# Patient Record
Sex: Male | Born: 1970 | Race: Black or African American | Hispanic: No | Marital: Married | Smoking: Former smoker
Health system: Southern US, Community
[De-identification: ages and names within clinical notes are randomized; demographics above are authoritative.]

## PROBLEM LIST (undated history)

## (undated) DIAGNOSIS — M7022 Olecranon bursitis, left elbow: Secondary | ICD-10-CM

## (undated) DIAGNOSIS — D649 Anemia, unspecified: Secondary | ICD-10-CM

## (undated) DIAGNOSIS — Z8701 Personal history of pneumonia (recurrent): Secondary | ICD-10-CM

## (undated) DIAGNOSIS — M542 Cervicalgia: Secondary | ICD-10-CM

## (undated) DIAGNOSIS — M4306 Spondylolysis, lumbar region: Secondary | ICD-10-CM

## (undated) HISTORY — PX: BACK SURGERY: SHX140

## (undated) HISTORY — DX: Cervicalgia: M54.2

## (undated) HISTORY — DX: Spondylolysis, lumbar region: M43.06

---
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
## 1989-04-02 HISTORY — PX: FOOT FRACTURE SURGERY: SHX645

## 1999-11-27 ENCOUNTER — Emergency Department (HOSPITAL_COMMUNITY): Admission: EM | Admit: 1999-11-27 | Discharge: 1999-11-27 | Payer: Self-pay | Admitting: Emergency Medicine

## 2002-02-12 ENCOUNTER — Encounter: Payer: Self-pay | Admitting: Emergency Medicine

## 2002-02-12 ENCOUNTER — Emergency Department (HOSPITAL_COMMUNITY): Admission: EM | Admit: 2002-02-12 | Discharge: 2002-02-12 | Payer: Self-pay | Admitting: Emergency Medicine

## 2002-03-05 ENCOUNTER — Ambulatory Visit (HOSPITAL_COMMUNITY): Admission: RE | Admit: 2002-03-05 | Discharge: 2002-03-05 | Payer: Self-pay | Admitting: Internal Medicine

## 2002-03-05 ENCOUNTER — Encounter: Admission: RE | Admit: 2002-03-05 | Discharge: 2002-03-05 | Payer: Self-pay | Admitting: Internal Medicine

## 2002-03-05 ENCOUNTER — Encounter: Payer: Self-pay | Admitting: Internal Medicine

## 2002-03-12 ENCOUNTER — Encounter: Admission: RE | Admit: 2002-03-12 | Discharge: 2002-03-12 | Payer: Self-pay | Admitting: Internal Medicine

## 2002-03-14 ENCOUNTER — Emergency Department (HOSPITAL_COMMUNITY): Admission: EM | Admit: 2002-03-14 | Discharge: 2002-03-15 | Payer: Self-pay | Admitting: Emergency Medicine

## 2002-09-03 ENCOUNTER — Encounter: Admission: RE | Admit: 2002-09-03 | Discharge: 2002-09-03 | Payer: Self-pay | Admitting: Internal Medicine

## 2003-03-01 ENCOUNTER — Encounter: Admission: RE | Admit: 2003-03-01 | Discharge: 2003-03-01 | Payer: Self-pay | Admitting: Internal Medicine

## 2003-03-25 ENCOUNTER — Encounter: Payer: Self-pay | Admitting: Internal Medicine

## 2003-10-29 ENCOUNTER — Emergency Department (HOSPITAL_COMMUNITY): Admission: EM | Admit: 2003-10-29 | Discharge: 2003-10-29 | Payer: Self-pay | Admitting: Family Medicine

## 2005-11-06 ENCOUNTER — Emergency Department (HOSPITAL_COMMUNITY): Admission: EM | Admit: 2005-11-06 | Discharge: 2005-11-06 | Payer: Self-pay | Admitting: Family Medicine

## 2005-12-24 ENCOUNTER — Emergency Department (HOSPITAL_COMMUNITY): Admission: EM | Admit: 2005-12-24 | Discharge: 2005-12-24 | Payer: Self-pay | Admitting: Family Medicine

## 2006-04-23 ENCOUNTER — Emergency Department (HOSPITAL_COMMUNITY): Admission: EM | Admit: 2006-04-23 | Discharge: 2006-04-23 | Payer: Self-pay | Admitting: Family Medicine

## 2006-09-05 ENCOUNTER — Emergency Department (HOSPITAL_COMMUNITY): Admission: EM | Admit: 2006-09-05 | Discharge: 2006-09-05 | Payer: Self-pay | Admitting: Emergency Medicine

## 2007-03-09 ENCOUNTER — Emergency Department (HOSPITAL_COMMUNITY): Admission: EM | Admit: 2007-03-09 | Discharge: 2007-03-09 | Payer: Self-pay | Admitting: Emergency Medicine

## 2007-03-13 ENCOUNTER — Ambulatory Visit: Payer: Self-pay | Admitting: Internal Medicine

## 2007-03-13 DIAGNOSIS — M542 Cervicalgia: Secondary | ICD-10-CM | POA: Insufficient documentation

## 2007-03-13 DIAGNOSIS — M47817 Spondylosis without myelopathy or radiculopathy, lumbosacral region: Secondary | ICD-10-CM | POA: Insufficient documentation

## 2007-04-07 ENCOUNTER — Encounter: Payer: Self-pay | Admitting: Internal Medicine

## 2007-04-08 ENCOUNTER — Ambulatory Visit (HOSPITAL_COMMUNITY): Admission: RE | Admit: 2007-04-08 | Discharge: 2007-04-08 | Payer: Self-pay | Admitting: Neurosurgery

## 2007-04-14 ENCOUNTER — Encounter: Payer: Self-pay | Admitting: Internal Medicine

## 2007-06-23 ENCOUNTER — Emergency Department (HOSPITAL_COMMUNITY): Admission: EM | Admit: 2007-06-23 | Discharge: 2007-06-23 | Payer: Self-pay | Admitting: Emergency Medicine

## 2008-01-26 ENCOUNTER — Ambulatory Visit: Payer: Self-pay | Admitting: Internal Medicine

## 2009-04-13 ENCOUNTER — Ambulatory Visit: Payer: Self-pay | Admitting: Internal Medicine

## 2010-07-26 ENCOUNTER — Encounter: Payer: Self-pay | Admitting: Internal Medicine

## 2010-11-30 ENCOUNTER — Inpatient Hospital Stay (INDEPENDENT_AMBULATORY_CARE_PROVIDER_SITE_OTHER)
Admission: RE | Admit: 2010-11-30 | Discharge: 2010-11-30 | Disposition: A | Discharge status: Home / Self Care | Payer: Managed Care, Other (non HMO) | Source: Ambulatory Visit | Attending: Family Medicine | Admitting: Family Medicine

## 2010-11-30 DIAGNOSIS — B353 Tinea pedis: Secondary | ICD-10-CM

## 2012-02-25 ENCOUNTER — Other Ambulatory Visit: Payer: Self-pay | Admitting: Internal Medicine

## 2012-02-25 NOTE — Progress Notes (Signed)
Reviewing pts on opioid combo pills with more than 325 mg tylenol. Pt has not seen [place] in a very long time. Med on med list but has never been filled. Will remove med and ask Doris to see if pt is still interested in coming to [facility].

## 2013-02-27 ENCOUNTER — Emergency Department (INDEPENDENT_AMBULATORY_CARE_PROVIDER_SITE_OTHER)
Admission: EM | Admit: 2013-02-27 | Discharge: 2013-02-27 | Disposition: A | Discharge status: Home / Self Care | Payer: Self-pay | Source: Home / Self Care | Attending: Emergency Medicine | Admitting: Emergency Medicine

## 2013-02-27 ENCOUNTER — Encounter (HOSPITAL_COMMUNITY): Payer: Self-pay | Admitting: Emergency Medicine

## 2013-02-27 DIAGNOSIS — K044 Acute apical periodontitis of pulpal origin: Secondary | ICD-10-CM

## 2013-02-27 DIAGNOSIS — K047 Periapical abscess without sinus: Secondary | ICD-10-CM

## 2013-02-27 MED ORDER — HYDROCODONE-ACETAMINOPHEN 5-325 MG PO TABS: 1.0000 | ORAL_TABLET | Freq: Four times a day (QID) | ORAL | Status: DC | PRN

## 2013-02-27 MED ORDER — AMOXICILLIN 875 MG PO TABS: 875.0000 mg | ORAL_TABLET | Freq: Two times a day (BID) | ORAL | Status: DC

## 2013-02-27 MED ORDER — IBUPROFEN 800 MG PO TABS: 800.0000 mg | ORAL_TABLET | Freq: Three times a day (TID) | ORAL | Status: DC | PRN

## 2013-02-27 NOTE — ED Provider Notes (Signed)
Medical screening examination/treatment/procedure(s) were performed by non-physician practitioner and as supervising physician I was immediately available for consultation/collaboration.  Teal Raben, M.D.  Syvanna Ciolino C Elester Apodaca, MD 02/27/13 2036 

## 2013-02-27 NOTE — ED Notes (Signed)
Dental pain left bottom tooth with a knot.  Onset yesterday

## 2013-02-27 NOTE — ED Provider Notes (Signed)
CSN: [id number]     Arrival date & time 02/27/13  1505 History   First MD Initiated Contact with Patient 02/27/13 1614     Chief Complaint  Patient presents with  . Dental Pain   (Consider location/radiation/quality/duration/timing/severity/associated sxs/prior Treatment) HPI Comments: 42 year old male presents complaining of possible dental abscess on the lower left posterior jaw. He has had a toothache in this area with increasing pain over the last couple of days. Starting last night, he noticed a knot. His wife will do this and said that he has a dental abscess. He has been taking ibuprofen without relief. He rates the pain as severe. He cannot chew anything with the outside of his mouth because it makes the pain much worse. No alleviating factors. He denies fever, chills, tongue swelling, throat swelling, shortness of breath.  Patient is a 42 y.o. male presenting with tooth pain.  Dental Pain Associated symptoms: no fever and no neck pain     Past Medical History  Diagnosis Date  . Lumbar spondylolysis     Surgery in 1992 (Dr. Jettie Booze). MRIs in 2003 and 2004. Neurosurg visit 12/04, no intervention at the time, had negative neuro exam  . Neck pain    Past Surgical History  Procedure Laterality Date  . Back surgery     No family history on file. History  Substance Use Topics  . Smoking status: Current Some Day Smoker  . Smokeless tobacco: Not on file  . Alcohol Use: No    Review of Systems  Constitutional: Negative for fever, chills and fatigue.  HENT: Positive for dental problem. Negative for sore throat.   Eyes: Negative for visual disturbance.  Respiratory: Negative for cough and shortness of breath.   Cardiovascular: Negative for chest pain, palpitations and leg swelling.  Gastrointestinal: Negative for nausea, vomiting, abdominal pain, diarrhea and constipation.  Genitourinary: Negative for dysuria, urgency, frequency and hematuria.  Musculoskeletal: Negative for  arthralgias, myalgias, neck pain and neck stiffness.  Skin: Negative for rash.  Neurological: Negative for dizziness, weakness and light-headedness.    Allergies  Review of patient's allergies indicates no known allergies.  Home Medications   Current Outpatient Rx  Name  Route  Sig  Dispense  Refill  . amoxicillin (AMOXIL) 875 MG tablet   Oral   Take 1 tablet (875 mg total) by mouth 2 (two) times daily.   20 tablet   0   . HYDROcodone-acetaminophen (NORCO) 5-325 MG per tablet   Oral   Take 1 tablet by mouth every 6 (six) hours as needed for moderate pain.   30 tablet   0   . ibuprofen (ADVIL,MOTRIN) 800 MG tablet   Oral   Take 1 tablet (800 mg total) by mouth every 8 (eight) hours as needed.   60 tablet   0    BP 125/86  Pulse 72  Temp(Src) 98.4 F (36.9 C) (Oral)  Resp 18  SpO2 100% Physical Exam  Nursing note and vitals reviewed. Constitutional: He is oriented to person, place, and time. He appears well-developed and well-nourished. No distress.  HENT:  Head: Normocephalic.  Mouth/Throat: Oropharynx is clear and moist and mucous membranes are normal. Dental abscesses (lateral to posterior lower left molar; has filling in that tooth ) present. No uvula swelling or dental caries.  Pulmonary/Chest: Effort normal. No respiratory distress.  Neurological: He is alert and oriented to person, place, and time. Coordination normal.  Skin: Skin is warm and dry. No rash noted. He is not diaphoretic.  Psychiatric: He has a normal mood and affect. Judgment normal.    ED Course  Procedures (including critical care time) Labs Review Labs Reviewed - No data to display Imaging Review No results found.    MDM   1. Dental infection    Treating with amoxicillin, ibuprofen, Norco. Followup with your dentist.  Meds ordered this encounter  Medications  . amoxicillin (AMOXIL) 875 MG tablet    Sig: Take 1 tablet (875 mg total) by mouth 2 (two) times daily.    Dispense:  20  tablet    Refill:  0    Order Specific Question:  Supervising Provider    Answer:  Lorenz Coaster, DAVID C [id number]  . ibuprofen (ADVIL,MOTRIN) 800 MG tablet    Sig: Take 1 tablet (800 mg total) by mouth every 8 (eight) hours as needed.    Dispense:  60 tablet    Refill:  0    Order Specific Question:  Supervising Provider    Answer:  Lorenz Coaster, DAVID C [id number]  . HYDROcodone-acetaminophen (NORCO) 5-325 MG per tablet    Sig: Take 1 tablet by mouth every 6 (six) hours as needed for moderate pain.    Dispense:  30 tablet    Refill:  0    Order Specific Question:  Supervising Provider    Answer:  Lorenz Coaster, DAVID C [[id number]]       Graylon Good, PA-C 02/27/13 1655

## 2013-11-30 ENCOUNTER — Encounter (HOSPITAL_COMMUNITY): Payer: Self-pay | Admitting: Emergency Medicine

## 2013-11-30 ENCOUNTER — Emergency Department (HOSPITAL_COMMUNITY)
Admission: EM | Admit: 2013-11-30 | Discharge: 2013-11-30 | Disposition: A | Discharge status: Home / Self Care | Payer: 59 | Source: Home / Self Care | Attending: Family Medicine | Admitting: Family Medicine

## 2013-11-30 DIAGNOSIS — W268XXA Contact with other sharp object(s), not elsewhere classified, initial encounter: Secondary | ICD-10-CM

## 2013-11-30 DIAGNOSIS — S61209A Unspecified open wound of unspecified finger without damage to nail, initial encounter: Secondary | ICD-10-CM

## 2013-11-30 DIAGNOSIS — S61219A Laceration without foreign body of unspecified finger without damage to nail, initial encounter: Secondary | ICD-10-CM

## 2013-11-30 MED ORDER — TETANUS-DIPHTH-ACELL PERTUSSIS 5-2.5-18.5 LF-MCG/0.5 IM SUSP
INTRAMUSCULAR | Status: AC
  Filled 2013-11-30: qty 0.5

## 2013-11-30 MED ORDER — TETANUS-DIPHTH-ACELL PERTUSSIS 5-2.5-18.5 LF-MCG/0.5 IM SUSP
0.5000 mL | Freq: Once | INTRAMUSCULAR | Status: AC
  Administered 2013-11-30: 0.5 mL via INTRAMUSCULAR

## 2013-11-30 MED ORDER — LIDOCAINE HCL (PF) 2 % IJ SOLN
INTRAMUSCULAR | Status: AC
  Filled 2013-11-30: qty 2

## 2013-11-30 NOTE — ED Provider Notes (Signed)
CSN: [id number]     Arrival date & time 11/30/13  1730 History   First MD Initiated Contact with Patient 11/30/13 1744     Chief Complaint  Patient presents with  . Extremity Laceration   (Consider location/radiation/quality/duration/timing/severity/associated sxs/prior Treatment) HPI Comments: 43 year old male presents for evaluation of the laceration over the lateral dorsal PIP of the left index finger. He was cutting a screen today when he cut the top of his finger with a box cutter. It was bleeding a lot but the bleeding has resolved with direct pressure. He denies any numbness distal to this. No systemic symptoms. No other injuries.   Past Medical History  Diagnosis Date  . Lumbar spondylolysis     Surgery in 1992 (Dr. Jettie Booze). MRIs in 2003 and 2004. Neurosurg visit 12/04, no intervention at the time, had negative neuro exam  . Neck pain    Past Surgical History  Procedure Laterality Date  . Back surgery     History reviewed. No pertinent family history. History  Substance Use Topics  . Smoking status: Current Some Day Smoker  . Smokeless tobacco: Not on file  . Alcohol Use: No    Review of Systems  Skin: Positive for wound (see history of present illness).  All other systems reviewed and are negative.   Allergies  Review of patient's allergies indicates no known allergies.  Home Medications   Prior to Admission medications   Medication Sig Start Date End Date Taking? Authorizing Provider  amoxicillin (AMOXIL) 875 MG tablet Take 1 tablet (875 mg total) by mouth 2 (two) times daily. 02/27/13   Graylon Good, PA-C  HYDROcodone-acetaminophen (NORCO) 5-325 MG per tablet Take 1 tablet by mouth every 6 (six) hours as needed for moderate pain. 02/27/13   Admir Blackwater Son Barkan, PA-C  ibuprofen (ADVIL,MOTRIN) 800 MG tablet Take 1 tablet (800 mg total) by mouth every 8 (eight) hours as needed. 02/27/13   Faraz Blackwater Mykel Sponaugle, PA-C   BP 121/78  Pulse 77  Temp(Src) 98.8 F (37.1 C)  (Oral)  SpO2 100% Physical Exam  Nursing note and vitals reviewed. Constitutional: He is oriented to person, place, and time. He appears well-developed and well-nourished. No distress.  HENT:  Head: Normocephalic.  Pulmonary/Chest: Effort normal. No respiratory distress.  Musculoskeletal:       Left hand: He exhibits tenderness and laceration. He exhibits normal capillary refill. Normal sensation noted. Normal strength noted.       Hands: Neurological: He is alert and oriented to person, place, and time. Coordination normal.  Skin: Skin is warm and dry. No rash noted. He is not diaphoretic.  Psychiatric: He has a normal mood and affect. Judgment normal.    ED Course  LACERATION REPAIR Date/Time: 11/30/2013 6:07 PM Performed by: Autumn Messing, H Authorized by: Bradd Canary D Consent: Verbal consent obtained. Risks and benefits: risks, benefits and alternatives were discussed Consent given by: patient Patient identity confirmed: verbally with patient Time out: Immediately prior to procedure a "time out" was called to verify the correct patient, procedure, equipment, support staff and site/side marked as required. Body area: upper extremity Location details: left index finger Laceration length: 1 cm Foreign bodies: no foreign bodies Tendon involvement: none Nerve involvement: none Anesthesia: digital block Local anesthetic: lidocaine 2% without epinephrine Anesthetic total: 4 ml Preparation: Patient was prepped and draped in the usual sterile fashion. Irrigation solution: saline Irrigation method: syringe Amount of cleaning: extensive Debridement: none Degree of undermining: none Skin closure: glue Approximation: close Patient  tolerance: Patient tolerated the procedure well with no immediate complications.   (including critical care time) Labs Review Labs Reviewed - No data to display  Imaging Review No results found.   MDM   1. Laceration of finger of left hand,  initial encounter    Laceration, repaired with Dermabond. Watch for signs of infection. Followup when necessary   TDaP updated    Meds ordered this encounter  Medications  . Tdap (BOOSTRIX) injection 0.5 mL    Sig:        Graylon Good, PA-C 11/30/13 1824

## 2013-11-30 NOTE — Discharge Instructions (Signed)

## 2013-11-30 NOTE — ED Notes (Signed)
C/o laceration to left index finger earlier today while trimming screen w box cutter

## 2013-12-01 NOTE — ED Provider Notes (Signed)
Medical screening examination/treatment/procedure(s) were performed by resident physician or non-physician practitioner and as supervising physician I was immediately available for consultation/collaboration.   Barkley Bruns MD.   Linna Hoff, MD 12/01/13 [phone]

## 2013-12-14 ENCOUNTER — Encounter (HOSPITAL_COMMUNITY): Payer: Self-pay | Admitting: Emergency Medicine

## 2013-12-14 ENCOUNTER — Emergency Department (HOSPITAL_COMMUNITY): Payer: 59

## 2013-12-14 DIAGNOSIS — Z8701 Personal history of pneumonia (recurrent): Secondary | ICD-10-CM | POA: Diagnosis not present

## 2013-12-14 DIAGNOSIS — R079 Chest pain, unspecified: Secondary | ICD-10-CM | POA: Insufficient documentation

## 2013-12-14 DIAGNOSIS — F17200 Nicotine dependence, unspecified, uncomplicated: Secondary | ICD-10-CM | POA: Diagnosis not present

## 2013-12-14 DIAGNOSIS — R6883 Chills (without fever): Secondary | ICD-10-CM | POA: Diagnosis present

## 2013-12-14 DIAGNOSIS — R05 Cough: Secondary | ICD-10-CM | POA: Diagnosis not present

## 2013-12-14 DIAGNOSIS — R059 Cough, unspecified: Secondary | ICD-10-CM | POA: Insufficient documentation

## 2013-12-14 DIAGNOSIS — J159 Unspecified bacterial pneumonia: Secondary | ICD-10-CM | POA: Diagnosis not present

## 2013-12-14 DIAGNOSIS — R61 Generalized hyperhidrosis: Secondary | ICD-10-CM | POA: Insufficient documentation

## 2013-12-14 DIAGNOSIS — R0682 Tachypnea, not elsewhere classified: Secondary | ICD-10-CM | POA: Insufficient documentation

## 2013-12-14 LAB — URINALYSIS, ROUTINE W REFLEX MICROSCOPIC
GLUCOSE, UA: NEGATIVE mg/dL
Ketones, ur: 15 mg/dL — AB
Nitrite: NEGATIVE
PROTEIN: 100 mg/dL — AB
SPECIFIC GRAVITY, URINE: 1.034 — AB (ref 1.005–1.030)
Urobilinogen, UA: 2 mg/dL — ABNORMAL HIGH (ref 0.0–1.0)
pH: 6 (ref 5.0–8.0)

## 2013-12-14 LAB — URINE MICROSCOPIC-ADD ON

## 2013-12-14 NOTE — ED Notes (Signed)
Cough non productive

## 2013-12-14 NOTE — ED Notes (Signed)
The pt has been ill for one week with a temp with chills feeling weak body aches.  Dizziness no nv or diarrhea and sleeping a lot still.  Urine is dark

## 2013-12-15 ENCOUNTER — Telehealth: Payer: Self-pay | Admitting: *Deleted

## 2013-12-15 ENCOUNTER — Emergency Department (HOSPITAL_COMMUNITY)
Admission: EM | Admit: 2013-12-15 | Discharge: 2013-12-15 | Disposition: A | Discharge status: Home / Self Care | Payer: 59 | Attending: Emergency Medicine | Admitting: Emergency Medicine

## 2013-12-15 DIAGNOSIS — J189 Pneumonia, unspecified organism: Secondary | ICD-10-CM

## 2013-12-15 LAB — CBC WITH DIFFERENTIAL/PLATELET
BASOS PCT: 0 % (ref 0–1)
Basophils Absolute: 0 10*3/uL (ref 0.0–0.1)
EOS ABS: 0 10*3/uL (ref 0.0–0.7)
Eosinophils Relative: 1 % (ref 0–5)
HCT: 33.3 % — ABNORMAL LOW (ref 39.0–52.0)
Hemoglobin: 11.1 g/dL — ABNORMAL LOW (ref 13.0–17.0)
Lymphocytes Relative: 15 % (ref 12–46)
Lymphs Abs: 1.1 10*3/uL (ref 0.7–4.0)
MCH: 29.6 pg (ref 26.0–34.0)
MCHC: 33.3 g/dL (ref 30.0–36.0)
MCV: 88.8 fL (ref 78.0–100.0)
MONO ABS: 1.2 10*3/uL — AB (ref 0.1–1.0)
Monocytes Relative: 17 % — ABNORMAL HIGH (ref 3–12)
NEUTROS ABS: 4.7 10*3/uL (ref 1.7–7.7)
Neutrophils Relative %: 67 % (ref 43–77)
PLATELETS: 291 10*3/uL (ref 150–400)
RBC: 3.75 MIL/uL — AB (ref 4.22–5.81)
RDW: 12.4 % (ref 11.5–15.5)
WBC: 7 10*3/uL (ref 4.0–10.5)

## 2013-12-15 LAB — BASIC METABOLIC PANEL
Anion gap: 13 (ref 5–15)
BUN: 21 mg/dL (ref 6–23)
CO2: 26 mEq/L (ref 19–32)
CREATININE: 1.15 mg/dL (ref 0.50–1.35)
Calcium: 9.1 mg/dL (ref 8.4–10.5)
Chloride: 97 mEq/L (ref 96–112)
GFR, EST AFRICAN AMERICAN: 87 mL/min — AB (ref >90)
GFR, EST NON AFRICAN AMERICAN: 75 mL/min — AB (ref >90)
Glucose, Bld: 114 mg/dL — ABNORMAL HIGH (ref 70–99)
Potassium: 3.9 mEq/L (ref 3.7–5.3)
Sodium: 136 mEq/L — ABNORMAL LOW (ref 137–147)

## 2013-12-15 MED ORDER — AZITHROMYCIN 250 MG PO TABS
500.0000 mg | ORAL_TABLET | Freq: Once | ORAL | Status: AC
  Administered 2013-12-15: 500 mg via ORAL
  Filled 2013-12-15: qty 2

## 2013-12-15 MED ORDER — AZITHROMYCIN 250 MG PO TABS: 250.0000 mg | ORAL_TABLET | Freq: Every day | ORAL | Status: DC

## 2013-12-15 MED ORDER — ACETAMINOPHEN-CODEINE #3 300-30 MG PO TABS
1.0000 | ORAL_TABLET | Freq: Once | ORAL | Status: AC
  Administered 2013-12-15: 1 via ORAL
  Filled 2013-12-15: qty 1

## 2013-12-15 MED ORDER — SODIUM CHLORIDE 0.9 % IV BOLUS (SEPSIS)
1000.0000 mL | Freq: Once | INTRAVENOUS | Status: AC
  Administered 2013-12-15: 1000 mL via INTRAVENOUS

## 2013-12-15 NOTE — ED Provider Notes (Signed)
CSN: [id number]     Arrival date & time 12/14/13  2208 History   First MD Initiated Contact with Patient 12/15/13 0012     Chief Complaint  Patient presents with  . multiple complaints      (Consider location/radiation/quality/duration/timing/severity/associated sxs/prior Treatment) HPI  Jay Vincent is a 43 y.o. male with no significant past medical history coming in with 5 days of viral URI like symptoms. Patient describes body aches initially. This been developed in the chills and diaphoresis. He states his body aches are now gone but the chills and diaphoresis continue. Today he had pleuritic chest pain is midsternal. He also describes a cough that is nonproductive. He denies any sick contacts. He has no abdominal pain nausea vomiting or diarrhea. He denies any changes in his urine.  10 Systems reviewed and are negative for acute change except as noted in the HPI.     Past Medical History  Diagnosis Date  . Lumbar spondylolysis     Surgery in 1992 (Dr. Jettie Booze). MRIs in 2003 and 2004. Neurosurg visit 12/04, no intervention at the time, had negative neuro exam  . Neck pain    Past Surgical History  Procedure Laterality Date  . Back surgery     No family history on file. History  Substance Use Topics  . Smoking status: Current Some Day Smoker  . Smokeless tobacco: Not on file  . Alcohol Use: No    Review of Systems    Allergies  Review of patient's allergies indicates no known allergies.  Home Medications   Prior to Admission medications   Medication Sig Start Date End Date Taking? Authorizing Provider  Pseudoeph-Doxylamine-DM-APAP (NYQUIL PO) Take 30 mLs by mouth at bedtime as needed (cold/sleep).   Yes Historical Provider, MD  Pseudoephedrine-DM-GG (TUSSIN COLD/COUGH PO) Take 15 mLs by mouth 2 (two) times daily as needed (cold).   Yes Historical Provider, MD   BP 118/78  Pulse 92  Temp(Src) 99.4 F (37.4 C) (Oral)  Resp 23  Ht  (1.956 m)  Wt 214  lb (97.07 kg)  BMI 25.37 kg/m2  SpO2 97% Physical Exam  Nursing note and vitals reviewed. Constitutional: He is oriented to person, place, and time. Vital signs are normal. He appears well-developed and well-nourished.  Non-toxic appearance. He does not appear ill. No distress.  HENT:  Head: Normocephalic and atraumatic.  Nose: Nose normal.  Mouth/Throat: Oropharynx is clear and moist. No oropharyngeal exudate.  Eyes: Conjunctivae and EOM are normal. Pupils are equal, round, and reactive to light. No scleral icterus.  Neck: Normal range of motion. Neck supple. No tracheal deviation, no edema, no erythema and normal range of motion present. No mass and no thyromegaly present.  Cardiovascular: Normal rate, regular rhythm, S1 normal, S2 normal, normal heart sounds, intact distal pulses and normal pulses.  Exam reveals no gallop and no friction rub.   No murmur heard. Pulses:      Radial pulses are 2+ on the right side, and 2+ on the left side.       Dorsalis pedis pulses are 2+ on the right side, and 2+ on the left side.  Pulmonary/Chest: Effort normal and breath sounds normal. No respiratory distress. He has no wheezes. He has no rhonchi. He has no rales.  Tachypnea noted  Abdominal: Soft. Normal appearance and bowel sounds are normal. He exhibits no distension, no ascites and no mass. There [id number]. There is no tenderness. There is no rebound, no guarding and  no CVA tenderness.  Musculoskeletal: Normal range of motion. He exhibits no edema and no tenderness.  Lymphadenopathy:    He has no cervical adenopathy.  Neurological: He is alert and oriented to person, place, and time. He has normal strength. No cranial nerve deficit or sensory deficit. GCS eye subscore is 4. GCS verbal subscore is 5. GCS motor subscore is 6.  Skin: Skin is warm, dry and intact. No petechiae and no rash noted. He is not diaphoretic. No erythema. No pallor.  Psychiatric: He has a normal mood and affect.  His behavior is normal. Judgment normal.    ED Course  Procedures (including critical care time) Labs Review Labs Reviewed  URINALYSIS, ROUTINE W REFLEX MICROSCOPIC - Abnormal; Notable for the following:    Color, Urine ORANGE (*)    Specific Gravity, Urine 1.034 (*)    Hgb urine dipstick TRACE (*)    Bilirubin Urine MODERATE (*)    Ketones, ur 15 (*)    Protein, ur 100 (*)    Urobilinogen, UA 2.0 (*)    Leukocytes, UA TRACE (*)    All other components within normal limits  CBC WITH DIFFERENTIAL - Abnormal; Notable for the following:    RBC 3.75 (*)    Hemoglobin 11.1 (*)    HCT 33.3 (*)    Monocytes Relative 17 (*)    Monocytes Absolute 1.2 (*)    All other components within normal limits  BASIC METABOLIC PANEL - Abnormal; Notable for the following:    Sodium 136 (*)    Glucose, Bld 114 (*)    GFR calc non Af Amer 75 (*)    GFR calc Af Amer 87 (*)    All other components within normal limits  URINE MICROSCOPIC-ADD ON    Imaging Review Dg Chest 2 View  12/14/2013   CLINICAL DATA:  Cough, shortness of breath and fever.  EXAM: CHEST  2 VIEW  COMPARISON:  None.  FINDINGS: The lungs are well-aerated. Right basilar airspace opacity is compatible with pneumonia. There is no evidence of pleural effusion or pneumothorax.  The heart is normal in size; the mediastinal contour is within normal limits. No acute osseous abnormalities are seen.  IMPRESSION: Right basilar pneumonia noted.   Electronically Signed   By: Roanna Raider M.D.   On: 12/14/2013 23:11     EKG Interpretation None      MDM   Final diagnoses:  None    Patient is to emergency department out of complaint of URI-like symptoms for the past 5 days. Chest x-ray does reveal a right-sided pneumonia. Patient labs reveal dehydration he was given a liter of fluid as well as azithromycin. We'll reassess and anticipate discharge.  Upon my repeat assessment of the patient's symptoms have drastically improved. He is  resting in the bed comfortably in no acute distress. There was no longer any tachypnea. Patient feels comfortable going home. He was instructed to continue his azithromycin, call the primary care physician within 3 days, and return for any worsening. His vital signs are now stable and within normal limits he is safe for discharge.  Tomasita Crumble, MD 12/15/13 [phone]

## 2013-12-15 NOTE — Telephone Encounter (Signed)
Pt called asking for directions on when to take antibiotic that was ordered in ED. Med is every 24 hours. Pt informed

## 2013-12-15 NOTE — ED Notes (Signed)
Pt. Reports feeling weak with chills, diaphoresis. States had flu last week and thought it was done but today noticed similar symptoms come back. Denies N/V/D.

## 2013-12-15 NOTE — Discharge Instructions (Signed)
Pneumonia, Adult Mr. Sem, you were seen today for pneumonia.  Continue taking antibiotics for the next 4 days and follow up with your regular doctor within 3 days for continued care.  If your symptoms worsen, return to the ED for repeat evaluation. Thank you. Pneumonia is an infection of the lungs. It may be caused by a germ (virus or bacteria). Some types of pneumonia can spread easily from person to person. This can happen when you cough or sneeze. HOME CARE  Only take medicine as told by your doctor.  Take your medicine (antibiotics) as told. Finish it even if you start to feel better.  Do not smoke.  You may use a vaporizer or humidifier in your room. This can help loosen thick spit (mucus).  Sleep so you are almost sitting up (semi-upright). This helps reduce coughing.  Rest. A shot (vaccine) can help prevent pneumonia. Shots are often advised for:  People over 64 years old.  Patients on chemotherapy.  People with long-term (chronic) lung problems.  People with immune system problems. GET HELP RIGHT AWAY IF:   You are getting worse.  You cannot control your cough, and you are losing sleep.  You cough up blood.  Your pain gets worse, even with medicine.  You have a fever.  Any of your problems are getting worse, not better.  You have shortness of breath or chest pain. MAKE SURE YOU:   Understand these instructions.  Will watch your condition.  Will get help right away if you are not doing well or get worse. Document Released: 09/05/2007 Document Revised: 06/11/2011 Document Reviewed: 06/09/2010 [facility] Patient Information 2015 [place], [place]. This information is not intended to replace advice given to you by your health care provider. Make sure you discuss any questions you have with your health care provider.

## 2015-01-25 ENCOUNTER — Encounter: Payer: Self-pay | Admitting: Internal Medicine

## 2015-01-25 ENCOUNTER — Ambulatory Visit (INDEPENDENT_AMBULATORY_CARE_PROVIDER_SITE_OTHER): Payer: 59 | Admitting: Internal Medicine

## 2015-01-25 ENCOUNTER — Ambulatory Visit (HOSPITAL_COMMUNITY)
Admission: RE | Admit: 2015-01-25 | Discharge: 2015-01-25 | Disposition: A | Discharge status: Home / Self Care | Payer: 59 | Source: Ambulatory Visit | Attending: Internal Medicine | Admitting: Internal Medicine

## 2015-01-25 VITALS — BP 134/81 | HR 59 | Temp 97.9°F | Ht 77.0 in | Wt 226.9 lb

## 2015-01-25 DIAGNOSIS — M79669 Pain in unspecified lower leg: Secondary | ICD-10-CM | POA: Insufficient documentation

## 2015-01-25 DIAGNOSIS — M79662 Pain in left lower leg: Secondary | ICD-10-CM

## 2015-01-25 DIAGNOSIS — R200 Anesthesia of skin: Secondary | ICD-10-CM | POA: Diagnosis not present

## 2015-01-25 DIAGNOSIS — Z23 Encounter for immunization: Secondary | ICD-10-CM

## 2015-01-25 DIAGNOSIS — M5116 Intervertebral disc disorders with radiculopathy, lumbar region: Secondary | ICD-10-CM | POA: Insufficient documentation

## 2015-01-25 DIAGNOSIS — R202 Paresthesia of skin: Secondary | ICD-10-CM | POA: Insufficient documentation

## 2015-01-25 DIAGNOSIS — Z0000 Encounter for general adult medical examination without abnormal findings: Secondary | ICD-10-CM | POA: Insufficient documentation

## 2015-01-25 LAB — D-DIMER, QUANTITATIVE: D-Dimer, Quant: 0.59 ug/mL-FEU — ABNORMAL HIGH (ref 0.00–0.48)

## 2015-01-25 MED ORDER — GABAPENTIN 300 MG PO CAPS: 300.0000 mg | ORAL_CAPSULE | Freq: Three times a day (TID) | ORAL | Status: DC

## 2015-01-25 MED ORDER — DICLOFENAC SODIUM 1 % TD GEL: 4.0000 g | Freq: Four times a day (QID) | TRANSDERMAL | Status: DC

## 2015-01-25 NOTE — Assessment & Plan Note (Signed)
Check lipid panel, BMP today.  Flu vaccine given.

## 2015-01-25 NOTE — Progress Notes (Signed)
Preliminary results by tech - Left Lower Ext. Venous Duplex Completed. Negative for deep and superficial vein thrombosis in the left lower extremity. Analisa Sledd, BS, RDMS, RVT  

## 2015-01-25 NOTE — Assessment & Plan Note (Signed)
Assessment: Patient presents w/ numbness and tingling since early September. Denies any loss of bowel or bladder, no complete loss of strength.  Has noticed weakness with plantar and dorsiflexion.  Attempted symptom relief with IcyHot, Aleve, and Tylenol with mild improvement of symptoms.  On physical exam, sensation intact, strength decreased on the left lower extremity.  Discussed treatment options at this time such as conservative therapy with medication vs repeat MRI and orthopedic referral vs PT.  Patient expresses desire to first proceed with conservative management with medication and home exercises as patient states he recalls PT exercises from 2009 after back surgery.  Plan: -proceed with conservative management: Gabapentin 300mg  TID, Voltaren gel QID prn, continue Aleve and Tylenol prn.  Start heat and ice therapy as tolerated -provided handout with home exercises -if needed in the future, can refer to PT or orthopedics.  If referred to orthopedics, will likely need repeat MRI as last was 2012 and current symptoms of left sided pain do not correlate with old MRI findings.

## 2015-01-25 NOTE — Assessment & Plan Note (Signed)
Assessment: 1 week history of left calf pain described as sharp, constant.  No history of DVT or PE.  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  Also, family history of DVT/PE in his father after a surgery.  Wells score for DVT of 1 (point given for tenderness) suggests low probability for DVT.  No rash or swelling to suggest a cellulitis.  Plan: -check D-dimer --> D-dimer positive at 0.59. -Lower extremity dopplers negative for DVT -continue symptomatic treatment with Voltaren gel, stretching, heat/ice therapy -will check BMP for any electrolyte abnormalities

## 2015-01-25 NOTE — Progress Notes (Signed)
Patient ID: Jay Vincent, male   DOB: 17-May-1970, 44 y.o.   MRN: [id number]   Subjective:   Patient ID: Jay Vincent male   DOB: 17-May-1970 44 y.o.   MRN: [id number]  HPI: [name] Frame is a 44 y.o. male with past medical history of low back pain s/p surgery in 2009 who presents to [facility] today with complaint of back pain and left calf pain.  1. 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  States he had surgery around this time with Dr. Jettie BoozeKritzler after failing physical therapy and trial of flexeril.  States he did not receive steroid injections.  In 2012, patient had repeat MRI after return of symptoms which revealed L4-L5 diffuse disc protrusion right greater than left, L5-S1 central disc bulge near the right S1 nerve root, and L3-L4 disc bulge.  Patient was set to have another surgery at this time but states he walked away from doing so after his symptoms improved without intervention.  Today, patient presents with numbness and tingling from his buttocks down to his foot on the left side.  States this has been constant since early September but is worsening.  States pain is relieved with resting or sitting.  Aggravated by extending his legs, walking especially on hard surfaces.  Patient states it is affecting his daily activities especially ability to play basketball as he notices weakness with plantar and dorsiflexion.  Patient has attempted to control symptoms with use of Icy Hot and alternating between Tylenol with Aleve.  2.  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  States last week he developed sharp, constant pain in his calf.  He has no prior history of the same.  States the pain keeps him up at night.  Pain is not related to exertion.  Denies any swelling, rash.  Reports tenderness to palpation.  No prior history of DVT or PE.    Family history positive for a father having blood clots after a surgery.  Denies any tachypnea or tachycardia.    Past Medical History  Diagnosis Date  . Lumbar spondylolysis     Surgery in 1992 (Dr. Jettie BoozeKritzler). MRIs in 2003 and 2004. Neurosurg visit 12/04, no intervention at the time, had negative neuro exam  . Neck pain    Current Outpatient Prescriptions  Medication Sig Dispense Refill  . diclofenac sodium (VOLTAREN) 1 % GEL Apply 4 g topically 4 (four) times daily. 1 Tube 2  . gabapentin (NEURONTIN) 300 MG capsule Take 1 capsule (300 mg total) by mouth 3 (three) times daily. 90 capsule 2   No current facility-administered medications for this visit.   No family history on file. Social History   Social History  . Marital Status: Married    Spouse Name: N/A  . Number of Children: N/A  . Years of Education: N/A   Social History Main Topics  . Smoking status: Current Some Day Smoker    Types: Cigars  . Smokeless tobacco: None     Comment: 3 cigars a week  . Alcohol Use: No  . Drug Use: No  . Sexual Activity: Not Asked   Other Topics Concern  . None   Social History Narrative   Review of Systems: Review of Systems  Constitutional: Negative for fever and chills.  Eyes: Negative for blurred  vision.  Respiratory: Negative for cough.   Cardiovascular: Negative for chest pain.  Gastrointestinal: Negative for nausea and vomiting.  Genitourinary: Negative for dysuria.  Musculoskeletal: Positive for back pain.  Skin: Negative for rash.  Neurological: Positive for tingling. Negative for dizziness and headaches.  Psychiatric/Behavioral: Negative for depression.     Objective:  Physical Exam: Filed Vitals:   01/25/15 0828  BP: 134/81  Pulse: 59  Temp: 97.9 F (36.6 C)  TempSrc: Oral  Height:  (1.956 m)  Weight: 226 lb 14.4 oz (102.921 kg)  SpO2: 100%   Physical Exam  Constitutional: He is oriented to person, place, and time. He appears well-developed and  well-nourished.  HENT:  Head: Normocephalic and atraumatic.  Eyes: EOM are normal.  Neck: Normal range of motion.  Cardiovascular: Normal rate and regular rhythm.   Pulmonary/Chest: Effort normal and breath sounds normal.  Abdominal: Soft. Bowel sounds are normal.  Musculoskeletal: He exhibits tenderness (left calf tenderness).  Negative Homan's sign. Strength 5/5 RLE Strength 3-4/5 LLE, more pronounced with dorsiflexion than plantarflexion. Sensation intact bilateral along dermatomal patterns  Neurological: He is alert and oriented to person, place, and time. No cranial nerve deficit.  Skin: Skin is warm and dry.  Psychiatric: He has a normal mood and affect.     Assessment & Plan:  Please see Problem List for Assessment and Plan.

## 2015-01-25 NOTE — Patient Instructions (Signed)
The following medications have been sent to your pharmacy: 1. Voltaren gel - you can apply this to your area of back pain up to 4 times per day. 2. Gabapentin  (also known as Neurontin) - take this medication 3 times per day.  You can continue to use alternating heat and ice therapy on your back for symptomatic relief.    If you notice loss of bowel or bladder, inability to walk, or other "red flag" symptoms, please go to the Emergency Department.  Herniated Disk With Rehab Between each vertebrae of the spine exists a disk. These disks contain a jelly-like material that helps cushion the spinal column. Occasionally, damage to the supportive ligaments of the vertebrae causes a disk to shift from its normal alignment and place pressure on surrounding structures, such as the spinal cord. This is called a herniated (ruptured) disk. SYMPTOMS   Pain in the back, that often affects one side.  Pain that gets worse with movement, sneezing, coughing, or straining.  Muscle spasms in the back.  Pain, numbness, or weakness affecting one arm or leg (depending on whether injury is in the neck or low back).  Muscle loss (if the condition has become chronic).  Loss of stool (bowel) or urine (bladder) function. CAUSES  Herniated disks result when a disk becomes weak. The disk eventually ruptures and places pressure on the spinal cord. Herniated disks may occur from sudden injury (acute trauma) such as heavy labor, or from ongoing (chronic) stress, such as obesity.  RISK INCREASES WITH:  Sports that involve downward or twisting pressure on the neck or spine (football, weightlifting, horseback riding competition, bowling, tennis, jogging, track, racquetball, gymnastics).  Poor strength and flexibility.  Failure to warm up properly before activity.  Family history of low back pain or disk disorders.  Previous back surgery (especially fusion).  Preexisting forward displacement of a vertebra  (spondylolisthesis).  Poor technique when lifting.  Prolonged sitting, especially with poor posture. PREVENTION  Learn and use proper technique when sitting or lifting.  Warm up and stretch properly before activity.  Maintain physical fitness:  Strength, flexibility, and endurance.  Cardiovascular fitness.  Maintain a healthy body weight.  If previously injured, avoid any intense physical activity that requires twisting of the body under uncontrollable conditions. PROGNOSIS  If treated properly, herniated disks are usually curable within 6 weeks. Sometimes, surgery is required.  RELATED COMPLICATIONS   Permanent numbness, weakness, or paralysis and muscle loss.  Chronic back pain.  Loss of bowel or bladder function.  Decreased sexual function.  Risks of surgery: infection, bleeding, injury to nerves (persistent or increased numbness, weakness, or paralysis), persistent back pain, and spinal headache. TREATMENT Treatment first involves resting from any aggravating activities and the use of ice and medicine to reduce pain and inflammation. As muscle spasms begin to decrease, it is important to perform strengthening and stretching exercises of the back muscles. These will help teach and reinforce proper body posture. These exercises may be performed at home, or with a therapist. A therapist may complete a further evaluation and recommend additional treatments, such as ultrasound, traction (for herniated disks of the neck), a cervical collar (for herniated disks of the neck), or a corset or back brace (for herniated disks of the low back). Prolonged rest may do more harm than good. Your therapist will teach you proper techniques for performing simple activities, such as lifting an object off the floor or using proper posture while sitting. At night, it is advised that  you sleep on your back, on a firm mattress, and place a pillow under your knees. Your caregiver may recommend oral  steroids or an injection of corticosteroids in the space around the spinal cord (epidural space) in order to reduce pain and inflammation. For severe cases, surgery is recommended. MEDICATION   If pain medicine is needed, nonsteroidal anti-inflammatory medicines (aspirin and ibuprofen), or other minor pain relievers (acetaminophen), are often advised.  Do not take pain medicine for 7 days before surgery.  Prescription pain relievers may be given if your caregiver thinks they are needed. Use only as directed and only as much as you need.  Ointments applied to the skin may be helpful.  Corticosteroid injections may be given. These injections should be reserved for the most serious cases, as they can only be given a certain number of times.  Oral steroids may be given to reduce inflammation, although not usually for severe (acute) injuries. HEAT AND COLD  Cold treatment (icing) relieves pain and reduces inflammation. Cold treatment should be applied for 10 to 15 minutes every 2 to 3 hours, and immediately after activity that aggravates your symptoms. Use ice packs or an ice massage.  Heat treatment may be used before performing stretching and strengthening activities prescribed by your caregiver, physical therapist, or athletic trainer. Use a heat pack or a warm water soak. SEEK MEDICAL CARE IF:   Symptoms get worse or do not improve in 2 to 4 weeks, despite treatment.  You develop loss of bowel or bladder function.  New, unexplained symptoms develop. (Drugs used in treatment may produce side effects.) EXERCISES  RANGE OF MOTION (ROM) AND STRETCHING EXERCISES - Herniated Disk (Ruptured Disk) Most people with low back pain will find that their symptoms get worse with excessive bending forward (flexion) or arching at the low back (extension). The exercises that will help resolve your symptoms will focus on the opposite motion. Your physician, physical therapist or athletic trainer will help  you determine which exercises will be most helpful to resolve your low back pain. Do not complete any exercises without first consulting with your caregiver. Discontinue any exercises that make your symptoms worse, until you speak to your caregiver. If you have pain, numbness or tingling that travels down into your buttocks, leg or foot, the goal of this therapy is for these symptoms to move closer to your back and to eventually go away. Sometimes, these leg symptoms will get better, but your low back pain may get worse. This is typically an indication of progress in your rehabilitation. Be sure to be very alert to any changes in your symptoms and to the activities you have done in the 24 hours prior to the change. Sharing this information with your caregiver will allow him or her to best treat your condition. These exercises may help you when beginning to rehabilitate your injury. Your symptoms may go away with or without further involvement from your physician, physical therapist or athletic trainer. While completing these exercises, remember:   Restoring tissue flexibility helps normal motion to return to the joints. This allows healthier, less painful movement and activity.  An effective stretch should be held for at least 30 seconds.  A stretch should never be painful. You should only feel a gentle lengthening or release in the stretched tissue. FLEXION RANGE OF MOTION AND STRETCHING EXERCISES: STRETCH - Flexion, Single Knee to Chest  Lie on a firm bed or floor, with both legs extended in front of you.  Keeping  one leg in contact with the floor, bring your opposite knee to your chest. Hold your leg in place by either grabbing behind your thigh or at your knee.  Pull until you feel a gentle stretch in your low back. Hold for __________ seconds.  Slowly release your grasp and repeat the exercise with the opposite side. Repeat __________ times. Complete this exercise __________ times per day.    STRETCH - Flexion, Double Knee to Chest   Lie on a firm bed or floor, with both legs extended in front of you.  Keeping one leg in contact with the floor, bring your opposite knee to your chest.  Tense your stomach muscles to support your back and then lift your other knee to your chest. Hold your legs in place by either grabbing behind your thighs or at your knees.  Pull both knees toward your chest until you feel a gentle stretch in your low back. Hold for __________ seconds.  Tense your stomach muscles and slowly return one leg at a time to the floor. Repeat __________ times. Complete this exercise __________ times per day.  STRETCH - Low Trunk Rotation  Lie on a firm bed or floor. Keeping your legs in front of you, bend your knees so they are both pointed toward the ceiling and your feet are flat on the floor.  Extend your arms out to the side. This will stabilize your upper body by keeping your shoulders in contact with the floor.  Gently and slowly drop both knees together to one side, until you feel a gentle stretch in your low back. Hold for __________ seconds.  Tense your stomach muscles to support your low back as you bring your knees back to the starting position. Repeat the exercise while dropping both knees to the other side. Repeat __________ times. Complete this exercise __________ times per day  EXTENSION RANGE OF MOTION AND FLEXIBILITY EXERCISES: STRETCH - Extension, Prone on Elbows   Lie on your stomach on the floor. (A bed will be too soft.) Place your palms about shoulder width apart.  Place your elbows under your shoulders. If this is too painful, stack pillows under your chest.  Allow your body to relax so that your hips drop lower and make contact more completely with the floor.  Hold this position for __________ seconds.  Slowly return to lying flat on the floor. Repeat __________ times. Complete this exercise __________ times per day.  RANGE OF MOTION -  Extension, Prone Press Ups   Lie on your stomach on the floor. (A bed will be too soft.) Place your palms about shoulder width apart and at the height of your head.  Keeping your back as relaxed as possible, slowly straighten your elbows while keeping your hips on the floor. You may adjust the placement of your hands to maximize your comfort. As you gain motion, your hands will come more underneath your shoulders.  Hold this position for __________ seconds.  Slowly return to lying flat on the floor. Repeat __________ times. Complete this exercise __________ times per day.  RANGE OF MOTION- Quadruped, Neutral Spine   Assume a hands and knees position on a firm surface. Keep your hands under your shoulders and your knees under your hips. You may place padding under your knees for comfort.  Drop your head and point your tail bone toward the ground below you. This will round out your low back like an angry cat. Hold this position for __________ seconds.  Slowly  lift your head and release your tail bone so that your back sags into a large arch, like an old horse.  Hold this position for __________ seconds.  Repeat this until you feel limber in your low back.  Now, find your "sweet spot." This will be the most comfortable position somewhere between the two previous positions. This is your neutral spine. Once you have found this position, tense your stomach muscles to support your low back.  Hold this position for __________ seconds. Repeat __________ times. Complete this exercise __________ times per day.  STRENGTHENING EXERCISES - Herniated Disk (Ruptured Disk) These exercises may help you when beginning to rehabilitate your injury. These exercises should be done near your "sweet spot." This is the neutral, low-back arch, somewhere between fully rounded and fully arched, that is your least painful position. When performed in this safe range of motion, these exercises can be used for people who  have either a flexion or extension based injury. These exercises may resolve your symptoms with or without further involvement from your physician, physical therapist or athletic trainer. While completing these exercises, remember:   Muscles can gain both the endurance and the strength needed for everyday activities through controlled exercises.  Complete these exercises as instructed by your physician, physical therapist or athletic trainer. Increase the resistance and repetitions only as guided.  You may experience muscle soreness or fatigue, but the pain or discomfort you are trying to eliminate should never worsen during these exercises. If this pain does get worse, stop and make sure you are following the directions exactly. If the pain is still present after adjustments, discontinue the exercise until you can discuss the trouble with your clinician. STRENGTHENING - Deep Abdominals, Pelvic Tilt   Lie on a firm bed or floor. Keeping your legs in front of you, bend your knees so they are both pointed toward the ceiling and your feet are flat on the floor.  Tense your lower abdominal muscles to press your low back into the floor. This motion will rotate your pelvis so that your tail bone is scooping upwards rather than pointing at your feet or into the floor.  With a gentle tension and even breathing, hold this position for __________ seconds. Repeat __________ times. Complete this exercise __________ times per day.  STRENGTHENING - Abdominals, Crunches   Lie on a firm bed or floor. Keeping your legs in front of you, bend your knees so they are both pointed toward the ceiling and your feet are flat on the floor. Cross your arms over your chest.  Slightly tip your chin down without bending your neck.  Tense your abdominals and slowly lift your trunk high enough so that your shoulder blades are just off the floor. Lifting higher can put too much stress on the low back and does not further  strengthen your abdominal muscles.  With control, return to the starting position. Repeat __________ times. Complete this exercise __________ times per day.  STRENGTHENING - Quadruped, Opposite UE/LE Lift  Assume a hands and knees position on a firm surface. Keep your hands under your shoulders and your knees under your hips. You may place padding under your knees for comfort.  Find your neutral spine and gently tense your abdominal muscles so that you can maintain this position. Your shoulders and hips should form a rectangle that is parallel with the floor and is not twisted.  Keeping your trunk steady, lift your right hand no higher than your shoulder. Then lift  your left leg no higher than your hip. Make sure you are not holding your breath. Hold this position for __________ seconds.  Continuing to keep your abdominal muscles tense and your back steady, slowly return to your starting position. Repeat with the opposite arm and leg. Repeat __________ times. Complete this exercise __________ times per day.  STRENGTHENING - Lower Abdominals, Double Knee Lift  Lie on a firm bed or floor. Keeping your legs in front of you, bend your knees so they are both pointed toward the ceiling and your feet are flat on the floor.  Tense your abdominal muscles to brace your low back and slowly lift both of your knees until they come over your hips. Be certain not to hold your breath.  Hold for __________ seconds. Using your abdominal muscles, return to the starting position in a slow and controlled manner. Repeat __________ times. Complete this exercise __________ times per day.  POSTURE AND BODY MECHANICS CONSIDERATIONS - Herniated Disc (Ruptured Disk) Keeping correct posture when sitting, standing or completing your activities will reduce the stress put on different body tissues, allowing injured tissues a chance to heal and limiting painful experiences. The following are general guidelines for improved  posture. Your physician or physical therapist will provide you with any instructions specific to your needs. While reading these guidelines, remember:  The exercises prescribed by your provider will help you build the flexibility and strength to maintain correct postures.  The correct posture provides the best environment for your joints to work. All of your joints have less wear and tear when properly supported by a spine with good posture. This means you will experience a healthier, less painful body.  Correct posture must be practiced with all of your activities, especially prolonged sitting and standing. Correct posture is as important when doing repetitive low-stress activities (typing) as it is when doing a single heavy-load activity (lifting). RESTING POSITIONS Consider which positions are most painful for you when choosing a resting position. If you have pain with flexion-based activities (sitting, bending, stooping, squatting), choose a position that allows you to rest in a less flexed posture. You would want to avoid curling into a fetal position on your side. If your pain gets worse with extension-based activities (prolonged standing, working overhead), avoid resting in an extended position such as sleeping on your stomach. Most people will find more comfort when they rest with their spine in a more neutral position, neither too rounded nor too arched. Lying on a non-sagging bed on your side, with a pillow between your knees, or on your back with a pillow under your knees will often provide some relief. Keep in mind, being in any one position for a prolonged period of time, no matter how correct your posture, can still lead to stiffness. PROPER SITTING POSTURE In order to minimize stress and discomfort on your spine, you must sit with correct posture. Sitting with good posture should be effortless for a healthy body. Returning to good posture is a gradual process. Many people can work toward  this most comfortably by using various supports until they have the flexibility and strength to maintain this posture on their own. When sitting with proper posture, your ears will fall over your shoulders and your shoulders will fall over your hips. You should use the back of the chair to support your upper back. Your low back will be in a neutral position, just slightly arched. You may place a small pillow or folded towel at the  base of your low back for support.  When working at a desk, create an environment that supports good, upright posture. Without extra support, muscles tire, which leads to excessive strain on joints and other tissues. Keep these recommendations in mind. CHAIR  A chair should be able to slide under your desk when your back makes contact with the back of the chair. This allows you to work closely.  The chair's height should allow your eyes to be level with the upper part of your monitor and your hands to be slightly lower than your elbows. BODY POSITION  Your feet should make contact with the floor. If this is not possible, use a foot rest.  Keep your ears over your shoulders. This will reduce stress on your neck and low back. INCORRECT SITTING POSTURES  If you are feeling tired and unable to assume a healthy sitting posture, do not slouch or slump. This puts excessive strain on your back tissues, causing more damage and pain. Healthier options include:  Using more support, like a lumbar pillow.  Switching tasks, to something that requires you to be upright or walking.  Talking a brief walk.  Lying down to rest in a neutral-spine position. PROLONGED STANDING WHILE SLIGHTLY LEANING FORWARD  When completing a task that requires you to lean forward while standing in one place for a long time, place either foot up on a stationary 2-4 inch high object, to help maintain the best posture. When both feet are on the ground, the low back tends to lose its slight inward curve. If  this curve flattens (or becomes too large), the back and your other joints will experience too much stress, tire more quickly and can cause pain. CORRECT STANDING POSTURES Proper standing posture should be assumed with all daily activities, even if they only take a few moments, like when brushing your teeth. As in sitting, your ears should fall over your shoulders and your shoulders should fall over your hips. You should keep a slight tension in your abdominal muscles to brace your spine. Your tailbone should point down to the ground, not behind your body, resulting in an over-extended swayback posture.  INCORRECT STANDING POSTURES  Common incorrect standing postures include a forward head, locked knees or an excessive swayback. WALKING Walk with an upright posture. Your ears, shoulders and hips should all line-up. PROLONGED ACTIVITY IN A FLEXED POSITION When completing a task that requires you to bend forward at your waist or lean over a low surface, try finding a way to stabilize 3 out of 4 of your limbs. You can place a hand or elbow on your thigh, or rest a knee on the surface you are reaching across. This will provide you more stability so that your muscles do not tire as quickly. By keeping your knees relaxed, or slightly bent, you will also reduce stress across your low back. CORRECT LIFTING TECHNIQUES DO :   Assume a wide stance. This will provide you more stability and the opportunity to get as close as possible to the object you are lifting.  Tense your abdominals to brace your spine. Then, bend at the knees and hips. Keeping your back locked in a neutral-spine position, lift using your leg muscles. Lift with your legs, keeping your back straight.  Test the weight of unknown objects before attempting to lift them.  Try to keep your elbows down by your sides, in order get the best strength from your shoulders when carrying an object.  Always  ask for help when lifting heavy or awkward  objects. INCORRECT LIFTING TECHNIQUES DO NOT:   Lock your knees when lifting, even if it is a small object.  Bend and twist. Pivot at your feet or move your feet when needing to change directions.  Assume that you can safely pick up even a paper clip, without proper posture.   This information is not intended to replace advice given to you by your health care provider. Make sure you discuss any questions you have with your health care provider.   Document Released: 03/19/2005 Document Revised: 04/09/2014 Document Reviewed: 07/01/2008 [employer] Interactive Patient Education [employer]

## 2015-01-26 LAB — BMP8+ANION GAP
ANION GAP: 13 mmol/L (ref 10.0–18.0)
BUN/Creatinine Ratio: 16 (ref 9–20)
BUN: 14 mg/dL (ref 6–24)
CO2: 26 mmol/L (ref 18–29)
CREATININE: 0.88 mg/dL (ref 0.76–1.27)
Calcium: 9 mg/dL (ref 8.7–10.2)
Chloride: 100 mmol/L (ref 97–106)
GFR calc Af Amer: 119 mL/min/{1.73_m2} (ref >59)
GFR calc non Af Amer: 103 mL/min/{1.73_m2} (ref >59)
Glucose: 91 mg/dL (ref 65–99)
POTASSIUM: 4.4 mmol/L (ref 3.5–5.2)
Sodium: 139 mmol/L (ref 136–144)

## 2015-01-26 LAB — LIPID PANEL
CHOLESTEROL TOTAL: 160 mg/dL (ref 100–199)
Chol/HDL Ratio: 3.3 ratio units (ref 0.0–5.0)
HDL: 48 mg/dL (ref >39)
LDL Calculated: 99 mg/dL (ref 0–99)
TRIGLYCERIDES: 67 mg/dL (ref 0–149)
VLDL Cholesterol Cal: 13 mg/dL (ref 5–40)

## 2015-01-26 LAB — CBC
HEMATOCRIT: 37.4 % — AB (ref 37.5–51.0)
Hemoglobin: 12 g/dL — ABNORMAL LOW (ref 12.6–17.7)
MCH: 29 pg (ref 26.6–33.0)
MCHC: 32.1 g/dL (ref 31.5–35.7)
MCV: 90 fL (ref 79–97)
Platelets: 305 10*3/uL (ref 150–379)
RBC: 4.14 x10E6/uL (ref 4.14–5.80)
RDW: 13.3 % (ref 12.3–15.4)
WBC: 4.8 10*3/uL (ref 3.4–10.8)

## 2015-01-26 NOTE — Progress Notes (Signed)
[facility] Clinic Attending  I saw and evaluated the patient.  I personally confirmed the key portions of the history and exam documented by Dr. Wallace and I reviewed pertinent patient test results.  The assessment, diagnosis, and plan were formulated together and I agree with the documentation in the resident's note. 

## 2015-01-27 ENCOUNTER — Encounter: Payer: Self-pay | Admitting: Internal Medicine

## 2015-02-07 ENCOUNTER — Other Ambulatory Visit: Payer: Self-pay | Admitting: Internal Medicine

## 2015-02-07 DIAGNOSIS — M5116 Intervertebral disc disorders with radiculopathy, lumbar region: Secondary | ICD-10-CM

## 2015-02-07 NOTE — Progress Notes (Signed)
I had seen Mr Curington with Dr Wallace last month at his appt. Spoke to wife - sxs started about Labor Day and are progressing - severity and freq. Whole left leg went numb when trying to get out of truck recently and fell. At last appt, Gaba was increased but that has provided no relief. At appt, pt wanted conservative tx bc previously, sxs resolved with just time but not the case this time - getting worse. Has known lumbar disc disease and prior surgery. I am concerned about nerve root impingement and pt has failed time and conservative tx and therefore, I am ordering lumbar MRI and F/U [facility] appt.  

## 2015-02-07 NOTE — Assessment & Plan Note (Addendum)
I had seen Mr Jay Vincent with Dr Earlene PlaterWallace last month at his appt. Spoke to wife - sxs started about Labor Day and are progressing - severity and freq. Whole left leg went numb when trying to get out of truck recently and fell. At last appt, Gaba was increased but that has provided no relief. At appt, pt wanted conservative tx bc previously, sxs resolved with just time but not the case this time - getting worse. Has known lumbar disc disease and prior surgery. I am concerned about nerve root impingement and pt has failed time and conservative tx and therefore, I am [facility] lumbar MRI [facility].

## 2015-02-11 ENCOUNTER — Ambulatory Visit (HOSPITAL_COMMUNITY)
Admission: RE | Admit: 2015-02-11 | Discharge: 2015-02-11 | Disposition: A | Discharge status: Home / Self Care | Payer: 59 | Source: Ambulatory Visit | Attending: Oncology | Admitting: Oncology

## 2015-02-11 ENCOUNTER — Ambulatory Visit (INDEPENDENT_AMBULATORY_CARE_PROVIDER_SITE_OTHER): Payer: 59 | Admitting: Internal Medicine

## 2015-02-11 ENCOUNTER — Encounter: Payer: Self-pay | Admitting: Internal Medicine

## 2015-02-11 VITALS — BP 130/65 | HR 59 | Temp 98.2°F

## 2015-02-11 DIAGNOSIS — M5116 Intervertebral disc disorders with radiculopathy, lumbar region: Secondary | ICD-10-CM

## 2015-02-11 DIAGNOSIS — D649 Anemia, unspecified: Secondary | ICD-10-CM

## 2015-02-11 DIAGNOSIS — Z0000 Encounter for general adult medical examination without abnormal findings: Secondary | ICD-10-CM

## 2015-02-11 DIAGNOSIS — Z8701 Personal history of pneumonia (recurrent): Secondary | ICD-10-CM | POA: Insufficient documentation

## 2015-02-11 DIAGNOSIS — R05 Cough: Secondary | ICD-10-CM | POA: Diagnosis not present

## 2015-02-11 MED ORDER — METHYLPREDNISOLONE 4 MG PO TBPK: ORAL_TABLET | ORAL | Status: DC

## 2015-02-11 MED ORDER — GABAPENTIN 400 MG PO CAPS: 400.0000 mg | ORAL_CAPSULE | Freq: Three times a day (TID) | ORAL | Status: DC

## 2015-02-11 MED ORDER — KETOROLAC TROMETHAMINE 30 MG/ML IJ SOLN
60.0000 mg | Freq: Once | INTRAMUSCULAR | Status: AC
  Administered 2015-02-11: 60 mg via INTRAMUSCULAR

## 2015-02-11 MED ORDER — KETOROLAC TROMETHAMINE 30 MG/ML IM SOLN: 60.0000 mg | Freq: Once | INTRAMUSCULAR | Status: DC

## 2015-02-11 MED ORDER — TRAMADOL HCL 50 MG PO TABS: 100.0000 mg | ORAL_TABLET | Freq: Two times a day (BID) | ORAL | Status: DC | PRN

## 2015-02-11 NOTE — Progress Notes (Signed)
Patient ID: Jay Vincent, male   DOB: 02-04-1971, 44 y.o.   MRN: [id number]    Subjective:   Patient ID: Jay Vincent male   DOB: 07/09/1970 44 y.o.   MRN: [id number]  HPI: Jay Vincent is a 44 y.o. 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   He has history of lumber disc herniation s/p L4-5 surgery in 1992 with last MRI on 04/30/10 with L4-5 diffuse broad-based disc protrusion and L3-4 and L5-S1 disc bulge. He was doing well until about 2 months ago when he began having low back pain that is now progressed to left sided sciatica without preceding injury, fall, or heavy lifting. He was seen on 01/25/15 and was prescribed gabapentin 300 mg TID which somewhat helped but had no response to voltaren gel. He has been using ibuprofen 800 Q 6 hr as well. He does not want to try muscle relaxant due to history of drowsiness. He has not used tramadol or narcotics for the pain. He has tried physical  therapy in the past with no response. He reports left sided LE weakness but denies bladder/bowel incontinence. He has been limping due to pain and fell about 2 weeks ago.  His pain is worse with sitting and improves with standing.   He has history of mild normocytic anemia with no prior work-up. He denies hematochezia, melena, epistaxis, or hematuria. He had microscopic hematuria on 12/14/13.  He has recently been using aspirin 81 mg daily. He denies family history of colon cancer and has never had colonoscopy.   He has history of right basilar pneumonia on 12/14/13 that was treated with azithromycin. He reports smoking cigars every few days. He denies cough, dyspnea, wheezing, or chest pain.      Past Medical History  Diagnosis Date  . Lumbar spondylolysis     Surgery in 1992 (Dr. Jettie Booze). MRIs in 2003 and 2004. Neurosurg visit 12/04, no  intervention at the time, had negative neuro exam  . Neck pain    Current Outpatient Prescriptions  Medication Sig Dispense Refill  . diclofenac sodium (VOLTAREN) 1 % GEL Apply 4 g topically 4 (four) times daily. 1 Tube 2  . gabapentin (NEURONTIN) 300 MG capsule Take 1 capsule (300 mg total) by mouth 3 (three) times daily. 90 capsule 2   No current facility-administered medications for this visit.   No family history on file. Social History   Social History  . Marital Status: Married    Spouse Name: N/A  . Number of Children: N/A  . Years of Education: N/A   Social History Main Topics  . Smoking status: Current Some Day Smoker    Types: Cigars  . Smokeless tobacco: None     Comment: 3 cigars a week  . Alcohol Use: No  . Drug Use: No  . Sexual Activity: Not Asked   Other Topics Concern  . None   Social History Narrative   Review of Systems: Review of Systems  Constitutional: Negative for fever and chills.  Eyes: Negative for blurred vision.  Respiratory: Negative for cough, shortness of breath and wheezing.   Cardiovascular: Negative for chest pain and leg swelling.  Gastrointestinal: Negative for nausea, vomiting, abdominal pain, diarrhea, constipation, blood in stool and melena.  Genitourinary: Negative for dysuria, urgency, frequency and hematuria.  Musculoskeletal: Positive for myalgias (calf pain) and  falls (2 weeks ago). Negative for back pain.  Neurological: Positive for sensory change (left sided sciatica ) and focal weakness (left LE). Negative for dizziness and headaches.  Psychiatric/Behavioral: Positive for substance abuse (cigar use).     Objective:  Physical Exam: Filed Vitals:   02/11/15 1443  BP: 130/65  Pulse: 59  Temp: 98.2 F (36.8 C)  TempSrc: Oral  SpO2: 100%    Physical Exam  Constitutional: He is oriented to person, place, and time. He appears well-developed and well-nourished. No distress.  Standing at times during exam due to pain    HENT:  Head: Normocephalic and atraumatic.  Right Ear: External ear normal.  Left Ear: External ear normal.  Nose: Nose normal.  Mouth/Throat: Oropharynx is clear and moist. No oropharyngeal exudate.  Eyes: Conjunctivae and EOM are normal. Pupils are equal, round, and reactive to light. Right eye exhibits no discharge. Left eye exhibits no discharge. No scleral icterus.  Neck: Normal range of motion. Neck supple.  Cardiovascular: Normal rate, regular rhythm and normal heart sounds.   No murmur heard. Pulmonary/Chest: Effort normal and breath sounds normal. No respiratory distress. He has no wheezes. He has no rales.  Abdominal: Soft. Bowel sounds are normal. He exhibits no distension. There is no tenderness. There is no rebound and no guarding.  Musculoskeletal: He exhibits no edema or tenderness.  No lumbar paraspinal tenderness.   Neurological: He is alert and oriented to person, place, and time. He has normal reflexes.  Positive left straight leg test. Decreased left LE lateral sensation to light touch in L5-S1 distrubution. Normal 5/5 muscle strength in b/l LE.    Skin: Skin is warm and dry. No rash noted. He is not diaphoretic. No erythema. No pallor.  Psychiatric: He has a normal mood and affect. His behavior is normal. Judgment and thought content normal.    Assessment & Plan:   Please see problem list for problem-based assessment and plan

## 2015-02-11 NOTE — Patient Instructions (Signed)
-  Will get an MRI of your back -Start taking the medrol dose pack as written on the bottle -Start taking 400 mg of gabapentin three times daily  -Start taking tramadol 100 mg twice a day as needed for pain -Take advil 600-800 mg up to four times daily as needed for pain  -Will check your bloodwork and please return the stool cards  -Will get a repeat chest xray to make sure your pneumonia is gone  -Will see you back in 4-6 weeks or sooner if needed -Very nice meeting you!  Sciatica Sciatica is pain, weakness, numbness, or tingling along your sciatic nerve. The nerve starts in the lower back and runs down the back of each leg. Nerve damage or certain conditions pinch or put pressure on the sciatic nerve. This causes the pain, weakness, and other discomforts of sciatica. HOME CARE   Only take medicine as told by your doctor.  Apply ice to the affected area for 20 minutes. Do this 3-4 times a day for the first 48-72 hours. Then try heat in the same way.  Exercise, stretch, or do your usual activities if these do not make your pain worse.  Go to physical therapy as told by your doctor.  Keep all doctor visits as told.  Do not wear high heels or shoes that are not supportive.  Get a firm mattress if your mattress is too soft to lessen pain and discomfort. GET HELP RIGHT AWAY IF:   You cannot control when you poop (bowel movement) or pee (urinate).  You have more weakness in your lower back, lower belly (pelvis), butt (buttocks), or legs.  You have redness or puffiness (swelling) of your back.  You have a burning feeling when you pee.  You have pain that gets worse when you lie down.  You have pain that wakes you from your sleep.  Your pain is worse than past pain.  Your pain lasts longer than 4 weeks.  You are suddenly losing weight without reason. MAKE SURE YOU:   Understand these instructions.  Will watch this condition.  Will get help right away if you are not doing  well or get worse.   This information is not intended to replace advice given to you by your health care provider. Make sure you discuss any questions you have with your health care provider.   Document Released: 12/27/2007 Document Revised: 12/08/2014 Document Reviewed: 07/29/2011 Elsevier Interactive Patient Education 2016 [employer]   General Instructions:   Please bring your medicines with you each time you come to clinic.  Medicines may include prescription medications, over-the-counter medications, herbal remedies, eye drops, vitamins, or other pills.   Progress Toward Treatment Goals:  No flowsheet data found.  Self Care Goals & Plans:  Self Care Goal 01/25/2015  Manage my medications take my medicines as prescribed; bring my medications to every visit; refill my medications on time  Eat healthy foods drink diet soda or water instead of juice or soda; eat more vegetables; eat foods that are low in salt; eat baked foods instead of fried foods; eat fruit for snacks and desserts    No flowsheet data found.   Care Management & Community Referrals:  No flowsheet data found.

## 2015-02-12 DIAGNOSIS — Z8701 Personal history of pneumonia (recurrent): Secondary | ICD-10-CM

## 2015-02-12 DIAGNOSIS — Z720 Tobacco use: Secondary | ICD-10-CM | POA: Insufficient documentation

## 2015-02-12 DIAGNOSIS — D649 Anemia, unspecified: Secondary | ICD-10-CM

## 2015-02-12 HISTORY — DX: Anemia, unspecified: D64.9

## 2015-02-12 HISTORY — DX: Personal history of pneumonia (recurrent): Z87.01

## 2015-02-12 LAB — ANEMIA PROFILE B
Basophils Absolute: 0.1 10*3/uL (ref 0.0–0.2)
Basos: 1 %
EOS (ABSOLUTE): 0.1 10*3/uL (ref 0.0–0.4)
Eos: 3 %
FERRITIN: 341 ng/mL (ref 30–400)
FOLATE: 3.4 ng/mL (ref >3.0)
HEMOGLOBIN: 12.4 g/dL — AB (ref 12.6–17.7)
Hematocrit: 37.5 % (ref 37.5–51.0)
IMMATURE GRANULOCYTES: 0 %
IRON SATURATION: 33 % (ref 15–55)
Immature Grans (Abs): 0 10*3/uL (ref 0.0–0.1)
Iron: 96 ug/dL (ref 38–169)
Lymphocytes Absolute: 1.7 10*3/uL (ref 0.7–3.1)
Lymphs: 38 %
MCH: 29.6 pg (ref 26.6–33.0)
MCHC: 33.1 g/dL (ref 31.5–35.7)
MCV: 90 fL (ref 79–97)
MONOCYTES: 9 %
Monocytes Absolute: 0.4 10*3/uL (ref 0.1–0.9)
NEUTROS ABS: 2.2 10*3/uL (ref 1.4–7.0)
Neutrophils: 49 %
PLATELETS: 330 10*3/uL (ref 150–379)
RBC: 4.19 x10E6/uL (ref 4.14–5.80)
RDW: 13.7 % (ref 12.3–15.4)
RETIC CT PCT: 1.6 % (ref 0.6–2.6)
TIBC: 288 ug/dL (ref 250–450)
UIBC: 192 ug/dL (ref 111–343)
Vitamin B-12: 1051 pg/mL — ABNORMAL HIGH (ref 211–946)
WBC: 4.4 10*3/uL (ref 3.4–10.8)

## 2015-02-12 LAB — URINALYSIS, ROUTINE W REFLEX MICROSCOPIC
BILIRUBIN UA: NEGATIVE
Glucose, UA: NEGATIVE
Ketones, UA: NEGATIVE
Leukocytes, UA: NEGATIVE
Nitrite, UA: NEGATIVE
PH UA: 6 (ref 5.0–7.5)
Protein, UA: NEGATIVE
RBC UA: NEGATIVE
Specific Gravity, UA: 1.024 (ref 1.005–1.030)
UUROB: 1 mg/dL (ref 0.2–1.0)

## 2015-02-12 LAB — HEPATIC FUNCTION PANEL
ALT: 46 IU/L — ABNORMAL HIGH (ref 0–44)
AST: 25 IU/L (ref 0–40)
Albumin: 4.5 g/dL (ref 3.5–5.5)
Alkaline Phosphatase: 65 IU/L (ref 39–117)
Bilirubin Total: 0.7 mg/dL (ref 0.0–1.2)
Bilirubin, Direct: 0.18 mg/dL (ref 0.00–0.40)
TOTAL PROTEIN: 7.4 g/dL (ref 6.0–8.5)

## 2015-02-12 LAB — SEDIMENTATION RATE: Sed Rate: 11 mm/hr (ref 0–15)

## 2015-02-12 LAB — HIV ANTIBODY (ROUTINE TESTING W REFLEX): HIV Screen 4th Generation wRfx: NONREACTIVE

## 2015-02-12 NOTE — Assessment & Plan Note (Addendum)
Assessment: Pt is a tobacco user with history of right basilar pneumonia on 12/14/13 that was treated with azithromycin who had no follow-up chest xray to ensure resolution.   Plan:  -Obtain 2-view chest xray ---> resolved pneumonia  -Pt counseled on tobacco cessation

## 2015-02-12 NOTE — Assessment & Plan Note (Addendum)
-  Obtain screening HIV Ab --> non-reactive  -Obtain liver function profile ---> ALT mildly elevated at 44, will obtain acute hepatitis panel  -Consider screening TSH level at next visit

## 2015-02-12 NOTE — Assessment & Plan Note (Addendum)
Assessment: Pt with history of chronic mild normocytic anemia of unclear etiology who presents with no active bleeding.   Plan:  -Obtain anemia profile B ---> Hg stable at 12.4 with no iron deficiency  -Obtain UA ---> resolved hematuria  -Pt given home stool cards  -Pt needs screening colonoscopy at age 44 -Continue to monitor

## 2015-02-12 NOTE — Assessment & Plan Note (Addendum)
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    Plan:  -Obtain stat MRI lumbar spine w/ & w/o contrast, scheduled for 02/13/15 -Obtain ESR ---> normal -Increase gabapentin from 300 mg TID to 400 mg TID  -Prescribe tramadol 100 mg BID PRN pain  -Prescribe medrol dose pack  -Continue ibuprofen 800 mg Q 6 hr PRN pain/inflammation -Pt given toradol 60 mg IM injection today   -Pt declined PT or neurosurgery referral at this time -Consider narcotic therapy if pain continues to be uncontrolled

## 2015-02-13 ENCOUNTER — Other Ambulatory Visit: Payer: 59

## 2015-02-15 LAB — HEPATITIS PANEL, ACUTE
HEP A IGM: NEGATIVE
HEP B S AG: NEGATIVE
Hep B C IgM: NEGATIVE
Hep C Virus Ab: <0.1 s/co ratio (ref 0.0–0.9)

## 2015-02-15 LAB — SPECIMEN STATUS REPORT

## 2015-02-15 NOTE — Progress Notes (Signed)
Internal Medicine Clinic Attending  Case discussed with Dr. Rabbani soon after the resident saw the patient.  We reviewed the resident's history and exam and pertinent patient test results.  I agree with the assessment, diagnosis, and plan of care documented in the resident's note.  

## 2015-02-16 LAB — SPECIMEN STATUS REPORT

## 2015-02-16 LAB — HEPATITIS PANEL, ACUTE
Hep A IgM: NEGATIVE
Hep B C IgM: NEGATIVE
Hepatitis B Surface Ag: NEGATIVE

## 2015-02-26 ENCOUNTER — Ambulatory Visit
Admission: RE | Admit: 2015-02-26 | Discharge: 2015-02-26 | Disposition: A | Discharge status: Home / Self Care | Payer: 59 | Source: Ambulatory Visit | Attending: Oncology | Admitting: Oncology

## 2015-02-26 DIAGNOSIS — M5116 Intervertebral disc disorders with radiculopathy, lumbar region: Secondary | ICD-10-CM

## 2015-02-28 ENCOUNTER — Telehealth: Payer: Self-pay | Admitting: *Deleted

## 2015-02-28 NOTE — Telephone Encounter (Signed)
Pt's wife request a call to discuss test results, [phone] ph in clinic, sending to dr's butcher, rabbani and Ciscorichardson

## 2015-03-02 NOTE — Telephone Encounter (Signed)
I discussed MRI results with her yesterday, thanks!  Dr. Johna Rolesabbani

## 2015-05-13 ENCOUNTER — Other Ambulatory Visit: Payer: Self-pay | Admitting: *Deleted

## 2015-05-13 DIAGNOSIS — M5116 Intervertebral disc disorders with radiculopathy, lumbar region: Secondary | ICD-10-CM

## 2015-05-13 MED ORDER — TRAMADOL HCL 50 MG PO TABS: 100.0000 mg | ORAL_TABLET | Freq: Two times a day (BID) | ORAL | Status: DC | PRN

## 2015-05-13 MED ORDER — GABAPENTIN 400 MG PO CAPS: 400.0000 mg | ORAL_CAPSULE | Freq: Three times a day (TID) | ORAL | Status: DC

## 2015-05-16 NOTE — Telephone Encounter (Signed)
Called to pharm 

## 2016-01-16 ENCOUNTER — Encounter: Payer: Self-pay | Admitting: Internal Medicine

## 2016-01-16 ENCOUNTER — Ambulatory Visit (INDEPENDENT_AMBULATORY_CARE_PROVIDER_SITE_OTHER): Payer: 59 | Admitting: Internal Medicine

## 2016-01-16 DIAGNOSIS — Z0000 Encounter for general adult medical examination without abnormal findings: Secondary | ICD-10-CM

## 2016-01-16 DIAGNOSIS — Z23 Encounter for immunization: Secondary | ICD-10-CM | POA: Diagnosis not present

## 2016-01-16 DIAGNOSIS — M7022 Olecranon bursitis, left elbow: Secondary | ICD-10-CM

## 2016-01-16 HISTORY — DX: Olecranon bursitis, left elbow: M70.22

## 2016-01-16 LAB — SYNOVIAL CELL COUNT + DIFF, W/ CRYSTALS
Crystals, Fluid: NONE SEEN
Lymphocytes-Synovial Fld: 10 % (ref 0–20)
Monocyte-Macrophage-Synovial Fluid: 12 % — ABNORMAL LOW (ref 50–90)
Neutrophil, Synovial: 78 % — ABNORMAL HIGH (ref 0–25)
WBC, SYNOVIAL: 840 /mm3 — AB (ref 0–200)

## 2016-01-16 NOTE — Progress Notes (Signed)
   CC: Patient is complaining of pain and swelling in his L elbow.   HPI:  [name] is a 45 y.o. M with a PMHx of conditions listed below presenting to the clinic complaining of pain and swelling in his L elbow. Please see problem based charting for the status of the patient's current and chronic medical conditions.   Past Medical History:  Diagnosis Date  . Lumbar spondylolysis    Surgery in 1992 (Dr. Jettie BoozeKritzler). MRIs in 2003 and 2004. Neurosurg visit 12/04, no intervention at the time, had negative neuro exam  . Neck pain     Review of Systems:  Pertinent positives mentioned in HPI. Remainder of all ROS negative.   Physical Exam:  Vitals:   01/16/16 1314  BP: 131/69  Pulse: 70  Temp: 98.6 F (37 C)  TempSrc: Oral  SpO2: 100%  Weight: 225 lb (102.1 kg)  Height: 6\' 5"  (1.956 m)   Physical Exam  Constitutional: He is oriented to person, place, and time. He appears well-developed and well-nourished. No distress.  HENT:  Head: Normocephalic and atraumatic.  Mouth/Throat: Oropharynx is clear and moist.  Eyes: EOM are normal. Pupils are equal, round, and reactive to light.  Neck: Neck supple. No tracheal deviation present.  Cardiovascular: Normal rate, regular rhythm and intact distal pulses.   Pulmonary/Chest: Effort normal and breath sounds normal. No respiratory distress.  Abdominal: Soft. Bowel sounds are normal. He exhibits no distension. There is no tenderness. There is no guarding.  Musculoskeletal: Normal range of motion.  L elbow: Area appears slightly warm and swollen. Normal ROM of elbow joint. Fluctuant mass palpated over the olecranon process. No erythema or rash noted.   Neurological: He is alert and oriented to person, place, and time.  Skin: Skin is warm and dry.    Assessment & Plan:   See Encounters Tab for problem based charting.  Patient seen with Dr. Josem KaufmannKlima

## 2016-01-16 NOTE — Patient Instructions (Addendum)
[name] it was nice meeting you today.  -Avoid leaning your elbow on hard surfaces.   -You may take over-the-counter Ibuprofen as needed for pain.   -I have ordered some labs today and will call you when the results come back.   -Please call the clinic immediately if you develop a fever or the pain/ swelling in your elbow become worse.    Elbow Bursitis Elbow bursitis is inflammation of the fluid-filled sac (bursa) between the tip of your elbow bone (olecranon) and your skin. Elbow bursitis may also be called olecranon bursitis. Normally, the olecranon bursa has only a small amount of fluid in it to cushion and protect your elbow bone. Elbow bursitis causes fluid to build up inside the bursa. Over time, this swelling and inflammation can cause pain when you bend or lean on your elbow.  CAUSES Elbow bursitis may be caused by:   Elbow injury (acute trauma).  Leaning on hard surfaces for long periods of time.  Infection from an injury that breaks the skin near your elbow.  A bone growth (spur) that forms at the tip of your elbow.  A medical condition that causes inflammation in your body, such as gout or rheumatoid arthritis.  The cause may also be unknown.  SIGNS AND SYMPTOMS  The first sign of elbow bursitis is usually swelling over the tip of your elbow. This can grow to be the size of a golf ball. This may start suddenly or develop gradually. You may also have:  Pain when bending or leaning on your elbow.  Restricted movement of your elbow.  If your bursitis is caused by an infection, symptoms may also include:  Redness, warmth, and tenderness of the elbow.  Drainage of pus from the swollen area over your elbow, if the skin breaks open. DIAGNOSIS  Your health care provider may be able to diagnose elbow bursitis based on your signs and symptoms, especially if you have recently been injured. Your health care provider will also do a physical exam. This may  include:  X-rays to look for a bone spur or a bone fracture.  Draining fluid from the bursa to test it for infection.  Blood tests to rule out gout or rheumatoid arthritis. TREATMENT  Treatment for elbow bursitis depends on the cause. Treatment may include:  Medicines. These may include:  Over-the-counter medicines to relieve pain and inflammation.  Antibiotic medicines to fight infection.  Injections of anti-inflammatory medicines (steroids).  Wrapping your elbow with a bandage.  Draining fluid from the bursa.  Wearing elbow pads.  If your bursitis does not get better with treatment, surgery may be needed to remove the bursa.  HOME CARE INSTRUCTIONS   Take medicines only as directed by your health care provider.  If you were prescribed an antibiotic medicine, finish all of it even if you start to feel better.  If your bursitis is caused by an injury, rest your elbow and wear your bandage as directed by your health care provider. You may alsoapply ice to the injured area as directed by your health care provider:  Put ice in a plastic bag.  Place a towel between your skin and the bag.  Leave the ice on for 20 minutes, 2-3 times per day.  Avoid any activities that cause elbow pain.  Use elbow pads or elbow wraps to cushion your elbow. SEEK MEDICAL CARE IF:  You have a fever.   Your symptoms do not get better with treatment.  Your pain  or swelling gets worse.  Your elbow pain or swelling goes away and then returns.  You have drainage of pus from the swollen area over your elbow.   This information is not intended to replace advice given to you by your health care provider. Make sure you discuss any questions you have with your health care provider.   Document Released: 04/18/2006 Document Revised: 04/09/2014 Document Reviewed: 11/25/2013 Elsevier Interactive Patient Education [employer]

## 2016-01-16 NOTE — Assessment & Plan Note (Addendum)
HPI Patient is complaining of abrupt onset L elbow pain and swelling which started 2 weeks ago. Denies any history of trauma to the area. Denies engaging in activities that require repetitive motion of his elbow joint. 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 States the pain and swelling in his elbow are improving over time since he has been using Voltaren gel. States the ROM of the joint is also getting better. Denies having any fever or chills. Does report having occasional pain in his knee joints.   Assessment L elbow olecranon bursitis. Elbow slightly warm and swollen on exam. Fluctuant mass palpated over the olecranon process. Olecranon bursa was aspirated at this visit and 3 cc serosanguinous fluid removed. Fluid was sent for analysis due to concern for possible CPPD because patient reported having occasional knee joint pains. However, fluid analysis came back negative for crystals. Fluid white count slightly elevated (840) and neutrophil count elevated (78%), likely in the setting of acute inflammation. Findings are not suggestive of septic bursitis.   Plan -OTC Ibuprofen as needed for pain and inflammation -Patient has been advised to stop leaning his elbow on hard surfaces  Procedure: Procedure: Aspiration of olecranon bursa of the left elbow Date: 01/16/16 Risks and benefits were discussed with the patient. Verbal and written consent obtained.  Area was marked and prepped in sterile fashion. Topical anesthetic spray was used for numbing. 22 G needle was introduced into the olecranon bursa and approximately 3 cc of serosanguinous fluid aspirated. Fluid was sent to the lab for analysis.  Estimated blood loss: minimal The patient tolerated the procedure well without complications.

## 2016-01-29 NOTE — Progress Notes (Signed)
I saw and evaluated the patient.  I personally confirmed the key portions of Dr. Gardiner Rhymeathore's history and exam and reviewed pertinent patient test results.  The assessment, diagnosis, and plan were formulated together and I agree with the documentation in the resident's note.  I was present at the patient's side during the entire olecranon bursa aspiration.  The patient tolerated the procedure well without immediate complications.

## 2016-09-17 DIAGNOSIS — H5213 Myopia, bilateral: Secondary | ICD-10-CM | POA: Diagnosis not present

## 2017-02-20 ENCOUNTER — Encounter: Payer: Self-pay | Admitting: Internal Medicine

## 2017-02-20 ENCOUNTER — Ambulatory Visit (INDEPENDENT_AMBULATORY_CARE_PROVIDER_SITE_OTHER): Payer: 59 | Admitting: Internal Medicine

## 2017-02-20 ENCOUNTER — Encounter (INDEPENDENT_AMBULATORY_CARE_PROVIDER_SITE_OTHER): Payer: Self-pay

## 2017-02-20 DIAGNOSIS — M5116 Intervertebral disc disorders with radiculopathy, lumbar region: Secondary | ICD-10-CM

## 2017-02-20 DIAGNOSIS — Z23 Encounter for immunization: Secondary | ICD-10-CM | POA: Diagnosis not present

## 2017-02-20 DIAGNOSIS — Z720 Tobacco use: Secondary | ICD-10-CM

## 2017-02-20 DIAGNOSIS — Z0000 Encounter for general adult medical examination without abnormal findings: Secondary | ICD-10-CM

## 2017-02-20 NOTE — Progress Notes (Signed)
Medicine attending: I personally interviewed and briefly examined this patient on the day of the patient visit and reviewed pertinent clinical data  with resident physician Dr. Lanelle BalLawrence Harbrecht and we discussed a management plan. Pleasant 46 y/o man encouraged by his wife to come in for routine health maintenance. Hx of successful back surgery & currently no longer in pain. No acute issues.

## 2017-02-20 NOTE — Progress Notes (Signed)
   CC: lower back pain  HPI:  Mr.Conan C Criss AlvineGoldston is a 46 y.o. male who presents today for a routine visit and follow-up for his lower back pain. He has remained pain free since completing the course of gabapentin 400mg  TID and tramadol 100mg  BID in 2017. He is able to avoid recurrence of the pain if he avoids impact activities or sitting in the wrong position. He denied recent illness, chest pain or palpitations, back pain, loss of bowel or bladder control, numbness or tingling in his feet or legs, weakness, pain with activity, swelling in his feet or ankles.  Past Medical History:  Diagnosis Date  . Lumbar spondylolysis    Surgery in 1992 (Dr. Jettie BoozeKritzler). MRIs in 2003 and 2004. Neurosurg visit 12/04, no intervention at the time, had negative neuro exam  . Neck pain    Review of Systems:  ROS negative except as per HPI.  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 -Does not appear to be depressed, PHQ-2 negative for depression -Recent Lipid profile unremarkable in 2016. Plan for repeat at next visit or at [age]yrs given minor risk factor of occasional tobacco use. -Due to colonoscopy in 2020. -Blood pressure within normal limits, will continue to monitor -BMI 26.5, >25 recommended, no specific guidance recommended  Physical Exam:  Vitals:   02/20/17 0855  BP: 134/75  Pulse: 61  Temp: (!) 97.5 F (36.4 C)  TempSrc: Oral  SpO2: 98%  Weight: 225 lb 11.2 oz (102.4 kg)  Height: 6\' 5"  (1.956 m)   Physical Exam  Constitutional: He appears well-nourished. No distress.  Cardiovascular: Normal rate and regular rhythm.  No murmur heard. Pulmonary/Chest: Effort normal and breath sounds normal. No respiratory distress.  Abdominal: Soft. Bowel sounds are normal.  Musculoskeletal: He exhibits no edema.    Psychiatric: He has a normal mood and affect.    Assessment & Plan:   See Encounters Tab for problem based charting.  Patient seen with Dr. Cyndie ChimeGranfortuna

## 2017-02-20 NOTE — Patient Instructions (Signed)
[facility].  Please feel free to call us at any time if your back pain returns or your develop other concerning symptoms.

## 2017-02-20 NOTE — Assessment & Plan Note (Addendum)
Assessment: No recent lumbar back pain. Gabapentin 400 TID and tramadol 100 BID combo from 11/17 was very effective in treating the back pain. It has not since reoccurred as long as he avoids high impact activities and sits with proper form.   Plan: Continue to monitor. Patient informed that he may contact if pain returns.

## 2017-02-20 NOTE — Assessment & Plan Note (Signed)
Received flu vaccine today.  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 -Does not appear to be depressed, PHQ-2 negative for depression -Recent Lipid profile unremarkable in 2016. Plan for repeat at next visit or at [age]yrs given minor risk factor of occasional tobacco use. -Due to colonoscopy in 2020. -Blood pressure within normal limits, will continue to monitor -BMI 26.5, >25 recommended, no specific guidance recommended

## 2017-09-23 ENCOUNTER — Ambulatory Visit (INDEPENDENT_AMBULATORY_CARE_PROVIDER_SITE_OTHER): Payer: 59 | Admitting: Internal Medicine

## 2017-09-23 ENCOUNTER — Other Ambulatory Visit: Payer: Self-pay

## 2017-09-23 ENCOUNTER — Encounter: Payer: Self-pay | Admitting: Internal Medicine

## 2017-09-23 VITALS — BP 119/77 | HR 63 | Temp 98.0°F | Ht 77.0 in | Wt 223.3 lb

## 2017-09-23 DIAGNOSIS — Z0000 Encounter for general adult medical examination without abnormal findings: Secondary | ICD-10-CM | POA: Diagnosis not present

## 2017-09-23 DIAGNOSIS — F17290 Nicotine dependence, other tobacco product, uncomplicated: Secondary | ICD-10-CM

## 2017-09-23 DIAGNOSIS — Z981 Arthrodesis status: Secondary | ICD-10-CM | POA: Diagnosis not present

## 2017-09-23 NOTE — Assessment & Plan Note (Addendum)
[name] presents for annual physical exam.  He is feeling well and has no acute complaints.  He has a history of lumbar radiculopathy which is well controlled and is not requiring any medications at this time.  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  He is planning on starting a new exercise regimen.  He does admit to smoking 3 to 4 cigars daily.  He will drink 6-12 beers over a weekend friends.  A/P: He is up-to-date on his health maintenance.  Blood pressure is within normal limits.  Depression screen is negative with PHQ-2 score of 0.  I advised him to return for the influenza vaccine during flu season.  He will also be eligible for colorectal cancer screening when he turns 47 years old in January 2020.  I discussed options including colonoscopy and FIT testing which he will discuss further with his PCP.  I have advised him to work on cutting back on his tobacco use towards complete cessation.  His last lipid profile was within normal limits in 2016; can consider repeat check on follow-up with PCP.

## 2017-09-23 NOTE — Progress Notes (Signed)
   CC: Physical exam  HPI:  [name] is a 47 y.o. 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 Please see problem based charting for status of patient's chronic medical issues.  Healthcare maintenance [name] presents for annual physical exam.  He is feeling well and has no acute complaints.  He has a history of lumbar radiculopathy which is well controlled and is not requiring any medications at this time.  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  He is planning on starting a new exercise regimen.  He does admit to smoking 3 to 4 cigars daily.  He will drink 6-12 beers over a weekend friends.  A/P: He is up-to-date on his health maintenance.  Blood pressure is within normal limits.  Depression screen is negative with PHQ-2 score of 0.  I advised him to return for the influenza vaccine during flu season.  He will also be eligible for colorectal cancer screening when he turns 47 years old in January 2020.  I discussed options including colonoscopy and FIT testing which he will discuss further with his PCP.  I have advised him to work on cutting back on his tobacco use towards complete cessation.  His last lipid profile was within normal limits in 2016; can consider repeat check on follow-up with PCP.    Past Medical History:  Diagnosis Date  . Lumbar spondylolysis    Surgery in 1992 (Jay Vincent). MRIs in 2003 and 2004. Neurosurg visit 12/04, no intervention at the time, had negative neuro exam  . Neck pain    Review of Systems:   Review of Systems  Constitutional: Negative for chills, diaphoresis and fever.  Respiratory: Negative for cough and shortness of breath.   Cardiovascular: Negative for chest pain.  Gastrointestinal: Negative for abdominal pain.  Genitourinary: Negative for dysuria.  Musculoskeletal: Negative for back pain and falls.  Neurological: Negative for dizziness, focal weakness and  loss of consciousness.     Physical Exam:  Vitals:   09/23/17 0908  BP: 119/77  Pulse: 63  Temp: 98 F (36.7 C)  TempSrc: Oral  SpO2: 99%  Weight: 223 lb 4.8 oz (101.3 kg)  Height: 6\' 5"  (1.956 m)   Physical Exam  Constitutional: He is oriented to person, place, and time. He appears well-developed and well-nourished. No distress.  HENT:  Head: Normocephalic and atraumatic.  Mouth/Throat: Oropharynx is clear and moist. No oropharyngeal exudate.  Eyes: Pupils are equal, round, and reactive to light. EOM are normal.  Cardiovascular: Normal rate and regular rhythm.  No murmur heard. Pulmonary/Chest: Effort normal. No respiratory distress. He has no wheezes. He has no rales.  Abdominal: Soft. Bowel sounds are normal. He exhibits no distension and no mass. There is no tenderness. There is no guarding.  Musculoskeletal: Normal range of motion. He exhibits no tenderness.  Neurological: He is alert and oriented to person, place, and time.  Skin: Skin is warm. He is not diaphoretic.  Psychiatric: He has a normal mood and affect.     Assessment & Plan:   See Encounters Tab for problem based charting.  Patient discussed with Dr. Criselda Vincent

## 2017-09-23 NOTE — Patient Instructions (Addendum)
It was a pleasure to see you [name].  Please slowly increase your activity to improve your conditioning.  See us again in 1 year or sooner if needed.

## 2017-09-26 NOTE — Progress Notes (Signed)
Internal Medicine Clinic Attending  Case discussed with Dr. Patel at the time of the visit.  We reviewed the resident's history and exam and pertinent patient test results.  I agree with the assessment, diagnosis, and plan of care documented in the resident's note.  

## 2017-11-08 ENCOUNTER — Ambulatory Visit: Payer: 59

## 2018-06-18 DIAGNOSIS — H5213 Myopia, bilateral: Secondary | ICD-10-CM | POA: Diagnosis not present

## 2018-09-13 ENCOUNTER — Emergency Department (HOSPITAL_COMMUNITY)
Admission: EM | Admit: 2018-09-13 | Discharge: 2018-09-13 | Disposition: A | Discharge status: Home / Self Care | Payer: 59 | Attending: Emergency Medicine | Admitting: Emergency Medicine

## 2018-09-13 ENCOUNTER — Emergency Department (HOSPITAL_COMMUNITY): Payer: 59

## 2018-09-13 ENCOUNTER — Encounter (HOSPITAL_COMMUNITY): Payer: Self-pay | Admitting: Pharmacy Technician

## 2018-09-13 ENCOUNTER — Other Ambulatory Visit: Payer: Self-pay

## 2018-09-13 ENCOUNTER — Ambulatory Visit (HOSPITAL_COMMUNITY): Admission: EM | Admit: 2018-09-13 | Discharge: 2018-09-13 | Payer: 59 | Source: Home / Self Care

## 2018-09-13 DIAGNOSIS — Z20828 Contact with and (suspected) exposure to other viral communicable diseases: Secondary | ICD-10-CM | POA: Insufficient documentation

## 2018-09-13 DIAGNOSIS — B349 Viral infection, unspecified: Secondary | ICD-10-CM | POA: Insufficient documentation

## 2018-09-13 DIAGNOSIS — F17210 Nicotine dependence, cigarettes, uncomplicated: Secondary | ICD-10-CM | POA: Diagnosis not present

## 2018-09-13 DIAGNOSIS — R0602 Shortness of breath: Secondary | ICD-10-CM | POA: Diagnosis not present

## 2018-09-13 DIAGNOSIS — M791 Myalgia, unspecified site: Secondary | ICD-10-CM | POA: Diagnosis present

## 2018-09-13 DIAGNOSIS — Z7189 Other specified counseling: Secondary | ICD-10-CM

## 2018-09-13 DIAGNOSIS — R05 Cough: Secondary | ICD-10-CM | POA: Diagnosis not present

## 2018-09-13 MED ORDER — ACETAMINOPHEN 500 MG PO TABS
1000.0000 mg | ORAL_TABLET | Freq: Once | ORAL | Status: AC
  Administered 2018-09-13: 1000 mg via ORAL
  Filled 2018-09-13: qty 2

## 2018-09-13 NOTE — ED Triage Notes (Signed)
Pt states sudden onset of cough body aches nasal congestion dizziness and fevers. Sx started on Friday. 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

## 2018-09-13 NOTE — ED Provider Notes (Signed)
[facility] Provider Note   CSN: [id number] Arrival date & time: 09/13/18  1636    History   Chief Complaint Chief Complaint  Patient presents with  . Shortness of Breath  . Fever    HPI Jay Vincent is a 48 y.o. male.     HPI Patient denies any past medical history.  He reports he however did get diagnosed once with pneumonia about 2 years ago.  He quit smoking about 6 months ago.  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  He reports he does wear a mask and tries to be very conscious about social distancing.  He reports when he got home yesterday he just felt a little achy and fatigued.  He thought maybe he had overdone it but then his wife checked his temperature and he did have a fever up to 102 last night.  Ports today he has some coughing.  No chest pain and cough is nonproductive.  Minimal sore throat yesterday.  No significant nasal congestion.  No nausea vomiting or diarrhea. no Urinary symptoms. Past Medical History:  Diagnosis Date  . Lumbar spondylolysis    Surgery in 1992 (Dr. Jettie BoozeKritzler). MRIs in 2003 and 2004. Neurosurg visit 12/04, no intervention at the time, had negative neuro exam  . Neck pain     Patient Active Problem List   Diagnosis Date Noted  . Olecranon bursitis, left elbow 01/16/2016  . Normocytic anemia 02/12/2015  . History of pneumonia 02/12/2015  . Tobacco use 02/12/2015  . Lumbar disc disease with radiculopathy 01/25/2015  . Healthcare maintenance 01/25/2015    Past Surgical History:  Procedure Laterality Date  . BACK SURGERY          Home Medications    Prior to Admission medications   Not on File    Family History No family history on file.  Social History Social History   Tobacco Use  . Smoking status: Current Some Day Smoker    Types: Cigars  . Tobacco comment: 3 cigars a week  Substance Use Topics  . Alcohol use: No    Alcohol/week: 0.0 standard  drinks  . Drug use: No     Allergies   Patient has no known allergies.   Review of Systems Review of Systems 10 Systems reviewed and are negative for acute change except as noted in the HPI.  Physical Exam Updated Vital Signs BP 115/78   Pulse 61   Temp 98.4 F (36.9 C) (Oral)   Resp (!) 21   Ht 6\' 5"  (1.956 m)   Wt 108.9 kg   SpO2 97%   BMI 28.46 kg/m   Physical Exam Constitutional:      Appearance: He is well-developed.     Comments: Patient is well in appearance.  He is nontoxic alert.  Eitel signs are normal.  HENT:     Head: Normocephalic and atraumatic.     Mouth/Throat:     Mouth: Mucous membranes are moist.     Pharynx: Oropharynx is clear.  Eyes:     Extraocular Movements: Extraocular movements intact.     Conjunctiva/sclera: Conjunctivae normal.  Neck:     Musculoskeletal: Neck supple.  Cardiovascular:     Rate and Rhythm: Normal rate and regular rhythm.     Heart sounds: Normal heart sounds.  Pulmonary:     Effort: Pulmonary effort is normal.     Breath sounds: Normal breath sounds.  Abdominal:     General: Bowel sounds are normal. There is no distension.     Palpations: Abdomen is soft.     Tenderness: There is no abdominal tenderness.  Musculoskeletal: Normal range of motion.        General: No swelling or tenderness.     Right lower leg: No edema.     Left lower leg: No edema.  Skin:    General: Skin is warm and dry.  Neurological:     Mental Status: He is alert and oriented to person, place, and time.     GCS: GCS eye subscore is 4. GCS verbal subscore is 5. GCS motor subscore is 6.     Coordination: Coordination normal.  Psychiatric:        Mood and Affect: Mood normal.      ED Treatments / Results  Labs (all labs ordered are listed, but only abnormal results are displayed) Labs Reviewed  NOVEL CORONAVIRUS, NAA (HOSPITAL ORDER, SEND-OUT TO REF LAB)    EKG None  Radiology Dg Chest Portable 1 View  Result Date: 09/13/2018  CLINICAL DATA:  Pt c/o sudden onset of cough, body aches, nasal congestion, dizziness and fevers. Sx started on Friday. 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 Pt smokes sometimesShortness of Breath EXAM: PORTABLE CHEST 1 VIEW COMPARISON:  02/11/2015 FINDINGS: Normal mediastinum and cardiac silhouette. Normal pulmonary vasculature. No evidence of effusion, infiltrate, or pneumothorax. No acute bony abnormality. IMPRESSION: No acute cardiopulmonary process. Electronically Signed   By: Suzy Bouchard M.D.   On: 09/13/2018 18:54    Procedures Procedures (including critical care time)  Medications Ordered in ED Medications  acetaminophen (TYLENOL) tablet 1,000 mg (1,000 mg Oral Given 09/13/18 1734)     Initial Impression / Assessment and Plan / ED Course  I have reviewed the triage vital signs and the nursing notes.  Pertinent labs & imaging results that were available during my care of the patient were reviewed by me and considered in my medical decision making (see chart for details).       Patient is clinically well in appearance.  He does not have comorbid illness.  Vital signs are normal.  He describes symptoms suggestive of a viral illness.  Counseled on possibility of COVID-19.  Send out test done.  Chest x-ray is negative.  At this time, patient stable for continued outpatient management.  Return precautions reviewed.  Patient counseled on self isolating.  Final Clinical Impressions(s) / ED Diagnoses   Final diagnoses:  Viral illness  Advice Given About Covid-19 Virus Infection    ED Discharge Orders    None       Charlesetta Shanks, MD 09/13/18 1925

## 2018-09-13 NOTE — Discharge Instructions (Addendum)
Person Under Monitoring Name: Jay Vincent  Location: [address] [place] [address]   Infection Prevention Recommendations for Individuals Confirmed to have, or Being Evaluated for, 2019 Novel Coronavirus (COVID-19) Infection Who Receive Care at Home  Individuals who are confirmed to have, or are being evaluated for, COVID-19 should follow the prevention steps below until a healthcare provider or local or state health department says they can return to normal activities.  Stay home except to get medical care You should restrict activities outside your home, except for getting medical care. Do not go to work, school, or public areas, and do not use public transportation or taxis.  Call ahead before visiting your doctor Before your medical appointment, call the healthcare provider and tell them that you have, or are being evaluated for, COVID-19 infection. This will help the healthcare providers office take steps to keep other people from getting infected. Ask your healthcare provider to call the local or state health department.  Monitor your symptoms Seek prompt medical attention if your illness is worsening (e.g., difficulty breathing). Before going to your medical appointment, call the healthcare provider and tell them that you have, or are being evaluated for, COVID-19 infection. Ask your healthcare provider to call the local or state health department.  Wear a facemask You should wear a facemask that covers your nose and mouth when you are in the same room with other people and when you visit a healthcare provider. People who live with or visit you should also wear a facemask while they are in the same room with you.  Separate yourself from other people in your home As much as possible, you should stay in a different room from other people in your home. Also, you should use a separate bathroom, if available.  Avoid sharing household items You should not  share dishes, drinking glasses, cups, eating utensils, towels, bedding, or other items with other people in your home. After using these items, you should wash them thoroughly with soap and water.  Cover your coughs and sneezes Cover your mouth and nose with a tissue when you cough or sneeze, or you can cough or sneeze into your sleeve. Throw used tissues in a lined trash can, and immediately wash your hands with soap and water for at least 20 seconds or use an alcohol-based hand rub.  Wash your [facility] your hands often and thoroughly with soap and water for at least 20 seconds. You can use an alcohol-based hand sanitizer if soap and water are not available and if your hands are not visibly dirty. Avoid touching your eyes, nose, and mouth with unwashed hands.   Prevention Steps for Caregivers and Household Members of Individuals Confirmed to have, or Being Evaluated for, COVID-19 Infection Being Cared for in the Home  If you live with, or provide care at home for, a person confirmed to have, or being evaluated for, COVID-19 infection please follow these guidelines to prevent infection:  Follow healthcare providers instructions Make sure that you understand and can help the patient follow any healthcare provider instructions for all care.  Provide for the patients basic needs You should help the patient with basic needs in the home and provide support for getting groceries, prescriptions, and other personal needs.  Monitor the patients symptoms If they are getting sicker, call his or her medical provider and tell them that the patient has, or is being evaluated for, COVID-19 infection. This will help the healthcare providers office  take steps to keep other people from getting infected. Ask the healthcare provider to call the local or state health department.  Limit the number of people who have contact with the patient If possible, have only one caregiver for the  patient. Other household members should stay in another home or place of residence. If this is not possible, they should stay in another room, or be separated from the patient as much as possible. Use a separate bathroom, if available. Restrict visitors who do not have an essential need to be in the home.  Keep older adults, very young children, and other sick people away from the patient Keep older adults, very young children, and those who have compromised immune systems or chronic health conditions away from the patient. This includes people with chronic heart, lung, or kidney conditions, diabetes, and cancer.  Ensure good ventilation Make sure that shared spaces in the home have good air flow, such as from an air conditioner or an opened window, weather permitting.  Wash your hands often Wash your hands often and thoroughly with soap and water for at least 20 seconds. You can use an alcohol based hand sanitizer if soap and water are not available and if your hands are not visibly dirty. Avoid touching your eyes, nose, and mouth with unwashed hands. Use disposable paper towels to dry your hands. If not available, use dedicated cloth towels and replace them when they become wet.  Wear a facemask and gloves Wear a disposable facemask at all times in the room and gloves when you touch or have contact with the patients blood, body fluids, and/or secretions or excretions, such as sweat, saliva, sputum, nasal mucus, vomit, urine, or feces.  Ensure the mask fits over your nose and mouth tightly, and do not touch it during use. Throw out disposable facemasks and gloves after using them. Do not reuse. Wash your hands immediately after removing your facemask and gloves. If your personal clothing becomes contaminated, carefully remove clothing and launder. Wash your hands after handling contaminated clothing. Place all used disposable facemasks, gloves, and other waste in a lined container before  disposing them with other household waste. Remove gloves and wash your hands immediately after handling these items.  Do not share dishes, glasses, or other household items with the patient Avoid sharing household items. You should not share dishes, drinking glasses, cups, eating utensils, towels, bedding, or other items with a patient who is confirmed to have, or being evaluated for, COVID-19 infection. After the person uses these items, you should wash them thoroughly with soap and water.  Wash laundry thoroughly Immediately remove and wash clothes or bedding that have blood, body fluids, and/or secretions or excretions, such as sweat, saliva, sputum, nasal mucus, vomit, urine, or feces, on them. Wear gloves when handling laundry from the patient. Read and follow directions on labels of laundry or clothing items and detergent. In general, wash and dry with the warmest temperatures recommended on the label.  Clean all areas the individual has used often Clean all touchable surfaces, such as counters, tabletops, doorknobs, bathroom fixtures, toilets, phones, keyboards, tablets, and bedside tables, every day. Also, clean any surfaces that may have blood, body fluids, and/or secretions or excretions on them. Wear gloves when cleaning surfaces the patient has come in contact with. Use a diluted bleach solution (e.g., dilute bleach with 1 part bleach and 10 parts water) or a household disinfectant with a label that says EPA-registered for coronaviruses. To make a bleach  solution at home, add 1 tablespoon of bleach to 1 quart (4 cups) of water. For a larger supply, add  cup of bleach to 1 gallon (16 cups) of water. Read labels of cleaning products and follow recommendations provided on product labels. Labels contain instructions for safe and effective use of the cleaning product including precautions you should take when applying the product, such as wearing gloves or eye protection and making sure you  have good ventilation during use of the product. Remove gloves and wash hands immediately after cleaning.  Monitor yourself for signs and symptoms of illness Caregivers and household members are considered close contacts, should monitor their health, and will be asked to limit movement outside of the home to the extent possible. Follow the monitoring steps for close contacts listed on the symptom monitoring form.   ? If you have additional questions, contact your local health department or call the epidemiologist on call at [phone] (available 24/7). ? This guidance is subject to change. For the most up-to-date guidance from [facility], please refer to their website: TripMetro.huhttps://www.cdc.gov/coronavirus/2019-ncov/hcp/guidance-prevent-spread.html    Jerlyn Lydrian C Abrell was evaluated in Emergency Department on 09/13/2018 for the symptoms described in the history of present illness. He was evaluated in the context of the global COVID-19 pandemic, which necessitated consideration that the patient might be at risk for infection with the SARS-CoV-2 virus that causes COVID-19. Institutional protocols and algorithms that pertain to the evaluation of patients at risk for COVID-19 are in a state of rapid change based on information released by regulatory bodies including the CDC and federal and state organizations. These policies and algorithms were followed during the patient's care in the ED.

## 2018-09-13 NOTE — ED Notes (Signed)
Patient Alert and oriented to baseline. Stable and ambulatory to baseline. Patient verbalized understanding of the discharge instructions.  Patient belongings were taken by the patient.   

## 2018-09-15 LAB — NOVEL CORONAVIRUS, NAA (HOSP ORDER, SEND-OUT TO REF LAB; TAT 18-24 HRS): SARS-CoV-2, NAA: NOT DETECTED

## 2018-10-22 ENCOUNTER — Encounter: Payer: 59 | Admitting: Internal Medicine

## 2018-11-04 NOTE — Progress Notes (Signed)
   CC: routine visit  HPI:[name] is a 48 y.o. male who presents for evaluation of his routine conditions. Please see individual problem based A/P for details.  Depression, PHQ-9: Based on the patients zero score we have decided to monitor.  Past Medical History:  Diagnosis Date  . Lumbar spondylolysis    Surgery in 1992 (Dr. Shirlean Schlein). MRIs in 2003 and 2004. Neurosurg visit 12/04, no intervention at the time, had negative neuro exam  . Neck pain    Review of Systems:  ROS negative except as per HPI.  Physical Exam: Vitals:   11/05/18 1310  BP: 117/76  Pulse: 78  Temp: 98 F (36.7 C)  TempSrc: Oral  SpO2: 100%  Weight: 222 lb 8 oz (100.9 kg)  Height: 6\' 5"  (1.956 m)   General: A/O x4, in no acute distress, afebrile, nondiaphoretic HEENT: PEERL, EMO intact Cardio: RRR, no mrg's  Pulmonary: CTA bilaterally, no wheezing or crackles  Abdomen: Bowel sounds normal, soft, nontender  MSK: BLE nontender, nonedematous Neuro: Strength 5/5 in the upper and lower extremities bilaterally, normal gait Psych: Appropriate affect, not depressed in appearance, engages well  Assessment & Plan:   See Encounters Tab for problem based charting.  Patient discussed with Dr. Evette Doffing

## 2018-11-05 ENCOUNTER — Ambulatory Visit (INDEPENDENT_AMBULATORY_CARE_PROVIDER_SITE_OTHER): Payer: 59 | Admitting: Internal Medicine

## 2018-11-05 ENCOUNTER — Encounter: Payer: Self-pay | Admitting: Internal Medicine

## 2018-11-05 ENCOUNTER — Other Ambulatory Visit: Payer: Self-pay

## 2018-11-05 VITALS — BP 117/76 | HR 78 | Temp 98.0°F | Ht 77.0 in | Wt 222.5 lb

## 2018-11-05 DIAGNOSIS — Z720 Tobacco use: Secondary | ICD-10-CM

## 2018-11-05 DIAGNOSIS — Z0000 Encounter for general adult medical examination without abnormal findings: Secondary | ICD-10-CM | POA: Diagnosis not present

## 2018-11-05 DIAGNOSIS — Z1211 Encounter for screening for malignant neoplasm of colon: Secondary | ICD-10-CM

## 2018-11-05 DIAGNOSIS — Z23 Encounter for immunization: Secondary | ICD-10-CM

## 2018-11-05 NOTE — Patient Instructions (Signed)
FOLLOW-UP INSTRUCTIONS When: yearly For: Annual exam What to bring: All of your medications  Today we discussed your annual exam. I do recommend that you have a colonoscopy completed which is guideline based beginning at the age of 63. I will notify you of the results of any abnormal labs from today's evaluation when available to me.   Thank you for your visit to the [facility] today. If you have any questions or concerns please call us at [phone].

## 2018-11-05 NOTE — Assessment & Plan Note (Signed)
  Colon cancer screening: Andie agreed to routine colon cancer screening with referral to GI for colonoscopy. We discussed the risk vs benefits of this therapy vs stool histochemical testing. Referral to GI placed

## 2018-11-05 NOTE — Assessment & Plan Note (Signed)
Vaccination for influenza: Not yet available in our clinic. Advised that he get this in the next 1-2 months particularly given the COVID crises.

## 2018-11-05 NOTE — Assessment & Plan Note (Signed)
  Tobacco use disorder: Quit his three cigars per day on New Years Eve.

## 2018-11-05 NOTE — Assessment & Plan Note (Signed)
Health Maintenance: Routine screening of BP normal at 117/76. BMI ~26, ordered lipid panel (recommended every 4-83yrs) and BMP for renal function.   Will follow-up and notify patient of any abnormal results.

## 2018-11-06 ENCOUNTER — Encounter: Payer: Self-pay | Admitting: Internal Medicine

## 2018-11-06 LAB — BMP8+ANION GAP
Anion Gap: 17 mmol/L (ref 10.0–18.0)
BUN/Creatinine Ratio: 16 (ref 9–20)
BUN: 16 mg/dL (ref 6–24)
CO2: 25 mmol/L (ref 20–29)
Calcium: 9.2 mg/dL (ref 8.7–10.2)
Chloride: 100 mmol/L (ref 96–106)
Creatinine, Ser: 0.99 mg/dL (ref 0.76–1.27)
GFR calc Af Amer: 102 mL/min/1.73 (ref >59)
GFR calc non Af Amer: 88 mL/min/1.73 (ref >59)
Glucose: 74 mg/dL (ref 65–99)
Potassium: 4.7 mmol/L (ref 3.5–5.2)
Sodium: 142 mmol/L (ref 134–144)

## 2018-11-06 LAB — LIPID PANEL
Chol/HDL Ratio: 3 ratio (ref 0.0–5.0)
Cholesterol, Total: 164 mg/dL (ref 100–199)
HDL: 54 mg/dL (ref >39)
LDL Calculated: 87 mg/dL (ref 0–99)
Triglycerides: 115 mg/dL (ref 0–149)
VLDL Cholesterol Cal: 23 mg/dL (ref 5–40)

## 2018-11-06 NOTE — Progress Notes (Signed)
Normal results of BMP and Lipid profile mailed to patient.

## 2018-11-06 NOTE — Progress Notes (Signed)
Internal Medicine Clinic Attending  Case discussed with Dr. Harbrecht at the time of the visit.  We reviewed the resident's history and exam and pertinent patient test results.  I agree with the assessment, diagnosis, and plan of care documented in the resident's note.   

## 2018-11-14 ENCOUNTER — Encounter: Payer: Self-pay | Admitting: Gastroenterology

## 2018-12-05 ENCOUNTER — Encounter: Payer: Self-pay | Admitting: Gastroenterology

## 2018-12-05 ENCOUNTER — Other Ambulatory Visit: Payer: Self-pay

## 2018-12-05 ENCOUNTER — Ambulatory Visit (AMBULATORY_SURGERY_CENTER): Payer: 59 | Admitting: *Deleted

## 2018-12-05 VITALS — Ht 77.0 in | Wt 216.0 lb

## 2018-12-05 DIAGNOSIS — Z1211 Encounter for screening for malignant neoplasm of colon: Secondary | ICD-10-CM

## 2018-12-05 MED ORDER — SUPREP BOWEL PREP KIT 17.5-3.13-1.6 GM/177ML PO SOLN: 1.0000 | Freq: Once | ORAL | 0 refills | Status: AC

## 2018-12-05 MED FILL — SUPREP BOWEL PREP KIT: 17.5-3.13-1 | 1 days supply | Qty: 354 | Fill #0

## 2018-12-05 NOTE — Progress Notes (Signed)
No egg or soy allergy known to patient  No issues with past sedation with any surgeries  or procedures, no intubation problems  No diet pills per patient No home 02 use per patient  No blood thinners per patient  Pt denies issues with constipation  No A fib or A flutter  EMMI video sent to pt's e mail   Pt verified name, DOB, address and insurance during PV today. Pt mailed instruction packet to included paper to complete and mail back to [facility] with addressed and stamped envelope, Emmi video, copy of consent form to read and not return, and instructions.PV completed over the phone. Pt encouraged to call with questions or issues   Pt is aware that care partner will wait in the car during procedure; if they feel like they will be too hot to wait in the car; they may wait in the lobby.  We want them to wear a mask (we do not have any that we can provide them), practice social distancing, and we will check their temperatures when they get here.  I did remind patient that their care partner needs to stay in the parking lot the entire time. Pt will wear mask into building.  

## 2018-12-19 ENCOUNTER — Encounter: Payer: Self-pay | Admitting: Gastroenterology

## 2018-12-19 ENCOUNTER — Other Ambulatory Visit: Payer: Self-pay

## 2018-12-19 ENCOUNTER — Ambulatory Visit (AMBULATORY_SURGERY_CENTER): Payer: 59 | Admitting: Gastroenterology

## 2018-12-19 VITALS — BP 107/80 | HR 54 | Temp 97.9°F | Resp 14 | Ht 77.0 in | Wt 216.0 lb

## 2018-12-19 DIAGNOSIS — Z1211 Encounter for screening for malignant neoplasm of colon: Secondary | ICD-10-CM | POA: Diagnosis not present

## 2018-12-19 HISTORY — PX: COLONOSCOPY: SHX174

## 2018-12-19 MED ORDER — SODIUM CHLORIDE 0.9 % IV SOLN
500.0000 mL | Freq: Once | INTRAVENOUS | Status: DC
Start: 2018-12-19 — End: 2018-12-19

## 2018-12-19 NOTE — Progress Notes (Signed)
Report given to PACU, vss 

## 2018-12-19 NOTE — Op Note (Signed)
[facility] Patient Name: Jay Vincent Procedure Date: 12/19/2018 8:34 AM MRN: [id number] Endoscopist: Sherilyn Cooter L. Myrtie Neither , MD Age: 48 Referring MD:  Date of Birth: October 03, 1970 Gender: Male Account #: [id number] Procedure:                Colonoscopy Indications:              Screening for colorectal malignant neoplasm, This                            is the patient's first colonoscopy Medicines:                Monitored Anesthesia Care Procedure:                Pre-Anesthesia Assessment:                           - Prior to the procedure, a History and Physical                            was performed, and patient medications and                            allergies were reviewed. The patient's tolerance of                            previous anesthesia was also reviewed. The risks                            and benefits of the procedure and the sedation                            options and risks were discussed with the patient.                            All questions were answered, and informed consent                            was obtained. Prior Anticoagulants: The patient has                            taken no previous anticoagulant or antiplatelet                            agents. ASA Grade Assessment: II - A patient with                            mild systemic disease. After reviewing the risks                            and benefits, the patient was deemed in                            satisfactory condition to undergo the procedure.  After obtaining informed consent, the colonoscope                            was passed under direct vision. Throughout the                            procedure, the patient's blood pressure, pulse, and                            oxygen saturations were monitored continuously. The                            Colonoscope was introduced through the anus and                            advanced to the the cecum,  identified by                            appendiceal orifice and ileocecal valve. The                            colonoscopy was performed without difficulty. The                            patient tolerated the procedure well. The quality                            of the bowel preparation was good after lavage. The                            ileocecal valve, appendiceal orifice, and rectum                            were photographed. Scope In: 8:49:05 AM Scope Out: 9:04:55 AM Scope Withdrawal Time: 0 hours 12 minutes 34 seconds  Total Procedure Duration: 0 hours 15 minutes 50 seconds  Findings:                 The perianal and digital rectal examinations were                            normal.                           The entire examined colon appeared normal on direct                            and retroflexion views. Complications:            No immediate complications. Estimated Blood Loss:     Estimated blood loss: none. Impression:               - The entire examined colon is normal on direct and                            retroflexion views.                           -  No specimens collected. Recommendation:           - Patient has a contact number available for                            emergencies. The signs and symptoms of potential                            delayed complications were discussed with the                            patient. Return to normal activities tomorrow.                            Written discharge instructions were provided to the                            patient.                           - Resume previous diet.                           - Continue present medications.                           - Repeat colonoscopy in 10 years for screening                            purposes. Dragan Tamburrino L. Loletha Carrow, MD 12/19/2018 9:07:57 AM This report has been signed electronically.

## 2018-12-19 NOTE — Patient Instructions (Signed)

## 2018-12-19 NOTE — Progress Notes (Signed)
Pt's states no medical or surgical changes since previsit or office visit.  Temp KA Vitals CW 

## 2018-12-23 ENCOUNTER — Telehealth: Payer: Self-pay | Admitting: *Deleted

## 2018-12-23 NOTE — Telephone Encounter (Signed)
1. Have you developed a fever since your procedure? no  2.   Have you had an respiratory symptoms (SOB or cough) since your procedure? no  3.   Have you tested positive for COVID 19 since your procedure no  4.   Have you had any family members/close contacts diagnosed with the COVID 19 since your procedure?  no   If yes to any of these questions please route to Joylene John, RN and Alphonsa Gin, Therapist, sports.  Follow up Call-  Call back number 12/19/2018  Post procedure Call Back phone  # [phone]  Permission to leave phone message Yes  Some recent data might be hidden     Patient questions:  Do you have a fever, pain , or abdominal swelling? No. Pain Score  0 *  Have you tolerated food without any problems? Yes.    Have you been able to return to your normal activities? Yes.    Do you have any questions about your discharge instructions: Diet   No. Medications  No. Follow up visit  No.  Do you have questions or concerns about your Care? No.  Actions: * If pain score is 4 or above: No action needed, pain <4.

## 2019-06-25 ENCOUNTER — Ambulatory Visit: Payer: 59 | Attending: Internal Medicine

## 2019-06-25 DIAGNOSIS — Z23 Encounter for immunization: Secondary | ICD-10-CM

## 2019-06-25 NOTE — Progress Notes (Signed)
   Covid-19 Vaccination Clinic  Name:  Jay Vincent    MRN: [id number] DOB: 1970/08/01  06/25/2019  [name] was observed post Covid-19 immunization for 15 minutes without incident. He was provided with Vaccine Information Sheet and instruction to access the V-Safe system.   [name] was instructed to call 911 with any severe reactions post vaccine: Marland Kitchen Difficulty breathing  . Swelling of face and throat  . A fast heartbeat  . A bad rash all over body  . Dizziness and weakness   Immunizations Administered    Name Date Dose VIS Date Route   Pfizer COVID-19 Vaccine 06/25/2019  4:09 PM 0.3 mL 03/13/2019 Intramuscular   Manufacturer: [employer], [employer]   Lot: [id number]   NDC: [address]-0

## 2019-07-18 ENCOUNTER — Ambulatory Visit: Payer: 59 | Attending: Internal Medicine

## 2019-07-18 DIAGNOSIS — Z23 Encounter for immunization: Secondary | ICD-10-CM

## 2019-07-18 NOTE — Progress Notes (Signed)
   Covid-19 Vaccination Clinic  Name:  WATSON ROBARGE    MRN: [id number] DOB: 1970/04/27  07/18/2019  Mr. Sansom was observed post Covid-19 immunization for 15 minutes without incident. He was provided with Vaccine Information Sheet and instruction to access the V-Safe system.   Mr. Stemm was instructed to call 911 with any severe reactions post vaccine: Marland Kitchen Difficulty breathing  . Swelling of face and throat  . A fast heartbeat  . A bad rash all over body  . Dizziness and weakness   Immunizations Administered    Name Date Dose VIS Date Route   Pfizer COVID-19 Vaccine 07/18/2019 10:49 AM 0.3 mL 03/13/2019 Intramuscular   Manufacturer: [employer], [employer]   Lot: [id number]   NDC: [address]-2

## 2019-07-20 ENCOUNTER — Ambulatory Visit: Payer: 59

## 2019-08-03 ENCOUNTER — Encounter: Payer: Self-pay | Admitting: *Deleted

## 2020-01-09 DIAGNOSIS — H5213 Myopia, bilateral: Secondary | ICD-10-CM | POA: Diagnosis not present

## 2020-02-23 ENCOUNTER — Ambulatory Visit (HOSPITAL_COMMUNITY)
Admission: RE | Admit: 2020-02-23 | Discharge: 2020-02-23 | Disposition: A | Discharge status: Home / Self Care | Payer: 59 | Source: Ambulatory Visit | Attending: Internal Medicine | Admitting: Internal Medicine

## 2020-02-23 ENCOUNTER — Ambulatory Visit (INDEPENDENT_AMBULATORY_CARE_PROVIDER_SITE_OTHER): Payer: 59 | Admitting: Internal Medicine

## 2020-02-23 ENCOUNTER — Other Ambulatory Visit: Payer: Self-pay

## 2020-02-23 ENCOUNTER — Encounter: Payer: Self-pay | Admitting: Internal Medicine

## 2020-02-23 VITALS — BP 130/80 | HR 81 | Temp 98.1°F | Ht 77.0 in | Wt 227.2 lb

## 2020-02-23 DIAGNOSIS — M5412 Radiculopathy, cervical region: Secondary | ICD-10-CM | POA: Insufficient documentation

## 2020-02-23 DIAGNOSIS — M47812 Spondylosis without myelopathy or radiculopathy, cervical region: Secondary | ICD-10-CM | POA: Diagnosis not present

## 2020-02-23 NOTE — Patient Instructions (Addendum)
Thank you for allowing [place] to provide your care today.  Your arm pain and tingling can be due to pinched nerve. I order a neck X ray and will call you with the result. You can continue taking over the counter medications such as aspirin or tylenol as needed for pain mean while. You can use heat or cold compress for pain as well.  Please follow-up in clinic as needed.   Should you have any questions or concerns please call the internal medicine clinic at [phone].

## 2020-02-23 NOTE — Progress Notes (Signed)
Acute Office Visit  Subjective:    Patient ID: Jay Vincent, male    DOB: June 01, 1970, 49 y.o.   MRN: [id number]  Chief Complaint  Patient presents with  . Arm Pain    HPI: Patient is in today for left arm pain and tingling. Please refer to problem based charting for further details and assessment and plan.   Past Medical History:  Diagnosis Date  . History of pneumonia 02/12/2015  . Lumbar spondylolysis    Surgery in 1992 (Dr. Jettie Booze). MRIs in 2003 and 2004. Neurosurg visit 12/04, no intervention at the time, had negative neuro exam  . Neck pain   . Normocytic anemia 02/12/2015  . Olecranon bursitis, left elbow 01/16/2016    Past Surgical History:  Procedure Laterality Date  . BACK SURGERY    . COLONOSCOPY  12/19/2018  . FOOT FRACTURE SURGERY Right 1991    Family History  Problem Relation Age of Onset  . Colon cancer Neg Hx   . Colon polyps Neg Hx   . Esophageal cancer Neg Hx   . Rectal cancer Neg Hx   . Stomach cancer Neg Hx     Social History   Socioeconomic History  . Marital status: Married    Spouse name: Not on file  . Number of children: Not on file  . Years of education: Not on file  . Highest education level: Not on file  Occupational History  . Not on file  Tobacco Use  . Smoking status: Former Smoker    Types: Cigars    Quit date: 2020    Years since quitting: 1.8  . Smokeless tobacco: Never Used  Substance and Sexual Activity  . Alcohol use: Yes    Alcohol/week: 0.0 standard drinks    Comment: occ beer   . Drug use: No  . Sexual activity: Not on file  Other Topics Concern  . Not on file  Social History Narrative  . Not on file   Social Determinants of Health   Financial Resource Strain:   . Difficulty of Paying Living Expenses: Not on file  Food Insecurity:   . Worried About Programme researcher, broadcasting/film/video in the Last Year: Not on file  . Ran Out of Food in the Last Year: Not on file  Transportation Needs:   . Lack of Transportation  (Medical): Not on file  . Lack of Transportation (Non-Medical): Not on file  Physical Activity:   . Days of Exercise per Week: Not on file  . Minutes of Exercise per Session: Not on file  Stress:   . Feeling of Stress : Not on file  Social Connections:   . Frequency of Communication with Friends and Family: Not on file  . Frequency of Social Gatherings with Friends and Family: Not on file  . Attends Religious Services: Not on file  . Active Member of Clubs or Organizations: Not on file  . Attends Banker Meetings: Not on file  . Marital Status: Not on file  Intimate Partner Violence:   . Fear of Current or Ex-Partner: Not on file  . Emotionally Abused: Not on file  . Physically Abused: Not on file  . Sexually Abused: Not on file    No outpatient medications prior to visit.   No facility-administered medications prior to visit.    No Known Allergies  Review of Systems  Constitutional: Negative.   Musculoskeletal: Positive for neck pain.  Neurological: Positive for numbness.  Objective:      BP 130/80 (BP Location: Right Arm, Patient Position: Sitting, Cuff Size: Small)   Pulse 81   Temp 98.1 F (36.7 C) (Oral)   Ht 6\' 5"  (1.956 m)   Wt 227 lb 3.2 oz (103.1 kg)   SpO2 98%   BMI 26.94 kg/m  Wt Readings from Last 3 Encounters:  02/23/20 227 lb 3.2 oz (103.1 kg)  12/19/18 216 lb (98 kg)  12/05/18 216 lb (98 kg)   Physical Exam Constitutional:      Appearance: Normal appearance.  HENT:     Head: Normocephalic and atraumatic.  Neck:     Vascular: No carotid bruit.  Musculoskeletal:        General: Tenderness present.     Left shoulder: Normal range of motion.     Right forearm: No swelling, edema or tenderness.     Left wrist: Normal pulse.   Neck rotation to the left side reproduce the pain and numbness. Neurological:     General: No focal deficit present.     Mental Status: He is alert and oriented to person, place, and time.      Sensory: No sensory deficit.     Motor: No weakness.  Psychiatric:        Mood and Affect: Mood normal.        Behavior: Behavior normal.        Thought Content: Thought content normal.        Judgment: Judgment normal.   Health Maintenance Due  Topic Date Due  . INFLUENZA VACCINE  11/01/2019    There are no preventive care reminders to display for this patient.   No results found for: TSH Lab Results  Component Value Date   WBC 4.4 02/11/2015   HGB 12.4 (L) 02/11/2015   HCT 37.5 02/11/2015   MCV 90 02/11/2015   PLT 330 02/11/2015   Lab Results  Component Value Date   NA 142 11/05/2018   K 4.7 11/05/2018   CO2 25 11/05/2018   GLUCOSE 74 11/05/2018   BUN 16 11/05/2018   CREATININE 0.99 11/05/2018   BILITOT 0.7 02/11/2015   ALKPHOS 65 02/11/2015   AST 25 02/11/2015   ALT 46 (H) 02/11/2015   PROT 7.4 02/11/2015   ALBUMIN 4.5 02/11/2015   CALCIUM 9.2 11/05/2018   ANIONGAP 13 12/15/2013   Lab Results  Component Value Date   CHOL 164 11/05/2018   Lab Results  Component Value Date   HDL 54 11/05/2018   Lab Results  Component Value Date   LDLCALC 87 11/05/2018   Lab Results  Component Value Date   TRIG 115 11/05/2018   Lab Results  Component Value Date   CHOLHDL 3.0 11/05/2018   No results found for: HGBA1C     Assessment & Plan:   Problem List Items Addressed This Visit      Nervous and Auditory   Cervical radiculopathy - Primary    Patient presented with 2 w Hx of left arm pain and numbness and tingling. 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  He has aching pain and his left side of neck to the arm and also some tingling and numbness of the entire left upper extremity down to all of his fingers. His symptoms does not get worse with activity. He denies any swelling or redness of left upper extremity.  He takes aspirin sometimes at night for pain  and takes a hot shower and both help with the  pain.  On exam, neck range of motion is complete but reproduces the pain when he moves his neck to the left side. Shoulder range of motion is normal and intact. No sensory or motor deficit.  Pulses are normal.  His presentation is suggestive of cervical radiculopathy, can be due to pinched nerve. No carotid bruit on exam.  After shared decision making, we decided to obtain an cervical x-ray to evaluate for any bony structural abnormality. -We will continue over-the-counter Tylenol or aspirin for heat and cold compress as needed -Cervical x-ray      Relevant Orders   DG Cervical Spine Complete     Patient discussed with Dr. Heide Spark.  No orders of the defined types were placed in this encounter.    Chevis Pretty, MD

## 2020-02-23 NOTE — Assessment & Plan Note (Signed)
Patient presented with 2 w Hx of left arm pain and numbness and tingling. 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  He has aching pain and his left side of neck to the arm and also some tingling and numbness of the entire left upper extremity down to all of his fingers. His symptoms does not get worse with activity. He denies any swelling or redness of left upper extremity.  He takes aspirin sometimes at night for pain and takes a hot shower and both help with the pain.  On exam, neck range of motion is complete but reproduces the pain when he moves his neck to the left side. Shoulder range of motion is normal and intact. No sensory or motor deficit.  Pulses are normal.  His presentation is suggestive of cervical radiculopathy, can be due to pinched nerve. No carotid bruit on exam.  After shared decision making, we decided to obtain an cervical x-ray to evaluate for any bony structural abnormality. -We will continue over-the-counter Tylenol or aspirin for heat and cold compress as needed -Cervical x-ray

## 2020-02-27 NOTE — Progress Notes (Signed)
Internal Medicine Clinic Attending  Case discussed with Dr. Masoudi  At the time of the visit.  We reviewed the resident's history and exam and pertinent patient test results.  I agree with the assessment, diagnosis, and plan of care documented in the resident's note.  

## 2020-03-01 ENCOUNTER — Other Ambulatory Visit: Payer: Self-pay | Admitting: Internal Medicine

## 2020-03-01 DIAGNOSIS — M5412 Radiculopathy, cervical region: Secondary | ICD-10-CM

## 2020-03-01 NOTE — Progress Notes (Signed)
Pt is informed about cervical spine X ray.  Decided to send a referall to PT to evaluate and recommend appropriate exercises. [name] l follow up in [facility] if no improvement or any worsening.

## 2020-03-15 ENCOUNTER — Encounter: Payer: Self-pay | Admitting: *Deleted

## 2020-04-15 NOTE — Addendum Note (Signed)
Addended by: Dorie Rank E on: 04/15/2020 01:40 PM   Modules accepted: Orders

## 2020-04-16 ENCOUNTER — Other Ambulatory Visit: Payer: 59

## 2020-04-16 DIAGNOSIS — Z20822 Contact with and (suspected) exposure to covid-19: Secondary | ICD-10-CM

## 2020-04-19 LAB — NOVEL CORONAVIRUS, NAA: SARS-CoV-2, NAA: NOT DETECTED

## 2020-08-27 ENCOUNTER — Encounter: Payer: Self-pay | Admitting: *Deleted

## 2020-08-31 ENCOUNTER — Encounter: Payer: Self-pay | Admitting: *Deleted

## 2020-10-04 ENCOUNTER — Encounter: Payer: Self-pay | Admitting: *Deleted

## 2021-01-09 DIAGNOSIS — H5213 Myopia, bilateral: Secondary | ICD-10-CM | POA: Diagnosis not present

## 2022-09-04 IMAGING — CR DG CERVICAL SPINE COMPLETE 4+V
5 series · 5 of 5 positions shown · non-contrast
Comparison: None.

CLINICAL DATA: Cervicalgia with left-sided radicular symptoms

EXAM:
CERVICAL SPINE - COMPLETE 4+ VIEW

[c-spine lat]
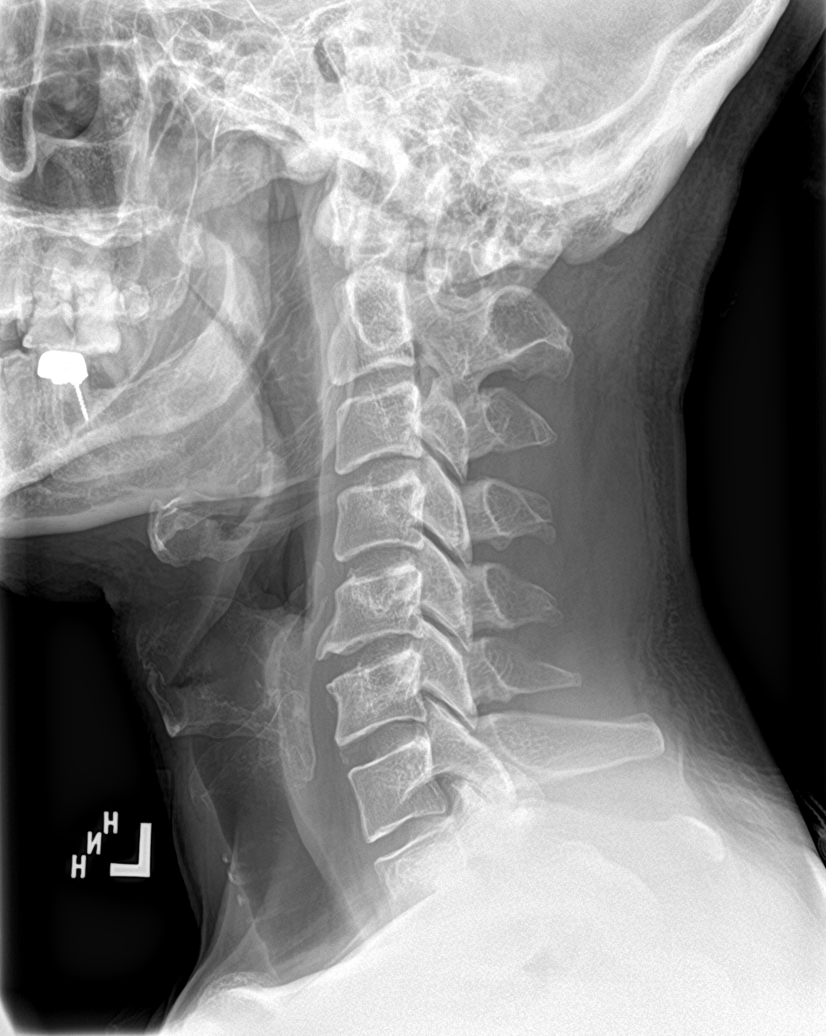

[c-spine obl (1 of 2)]
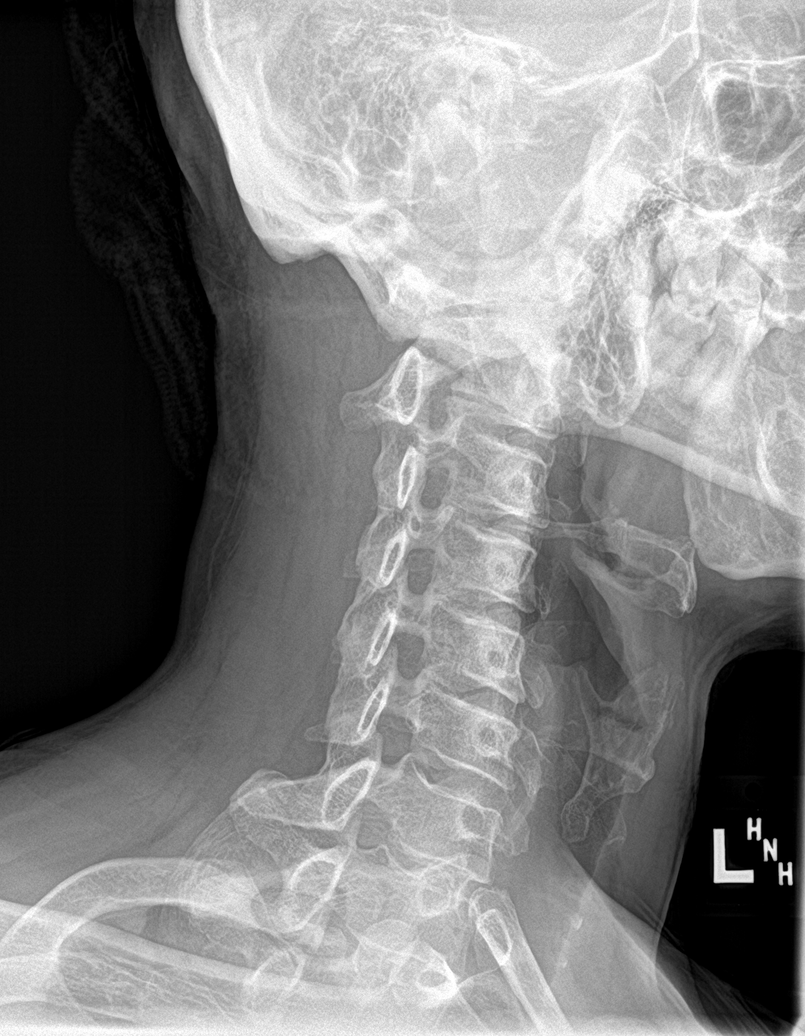

[c-spine obl (2 of 2)]
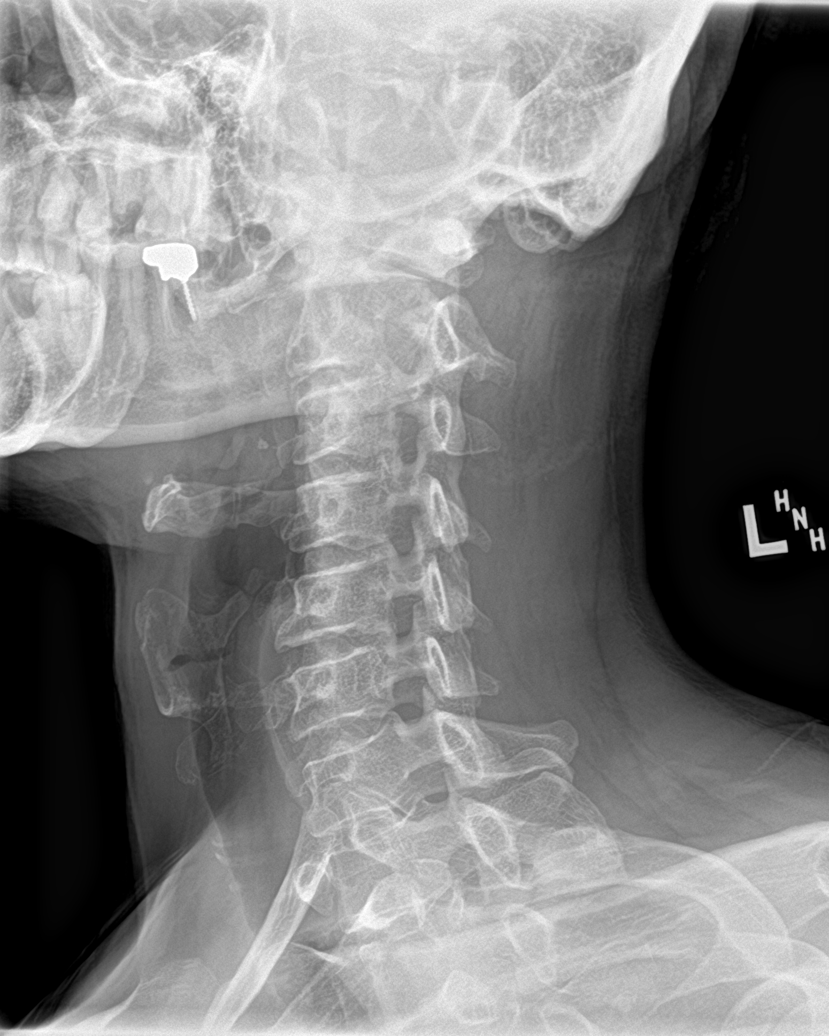

[c-spine ap]
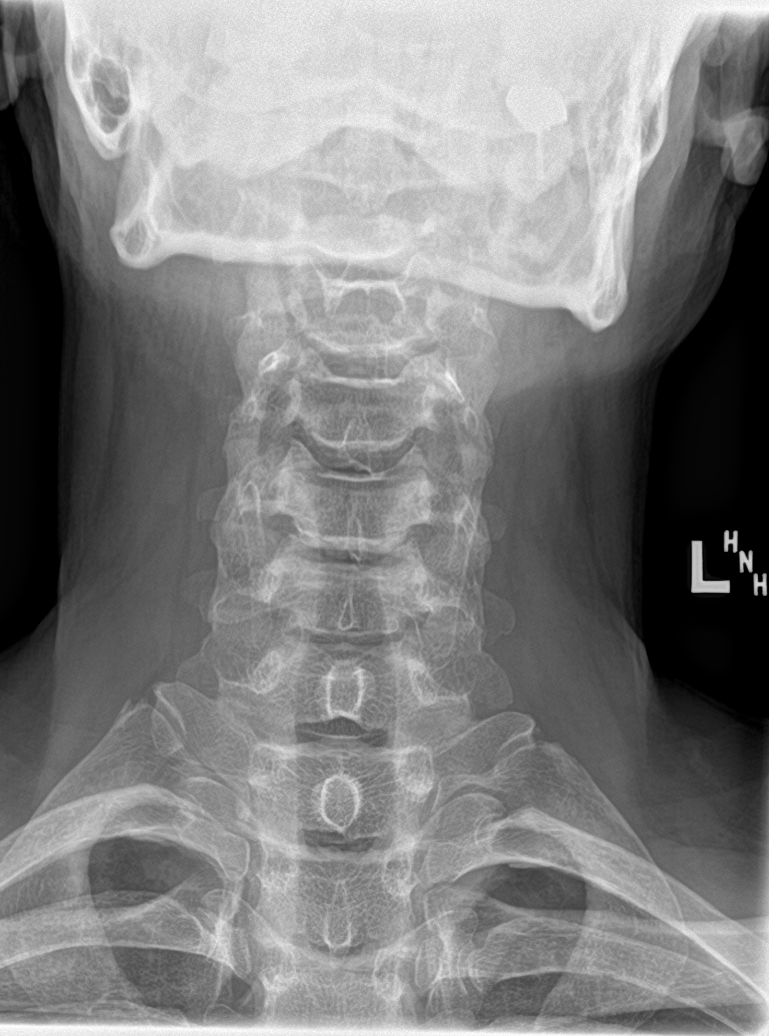

[c-spine open mouth]
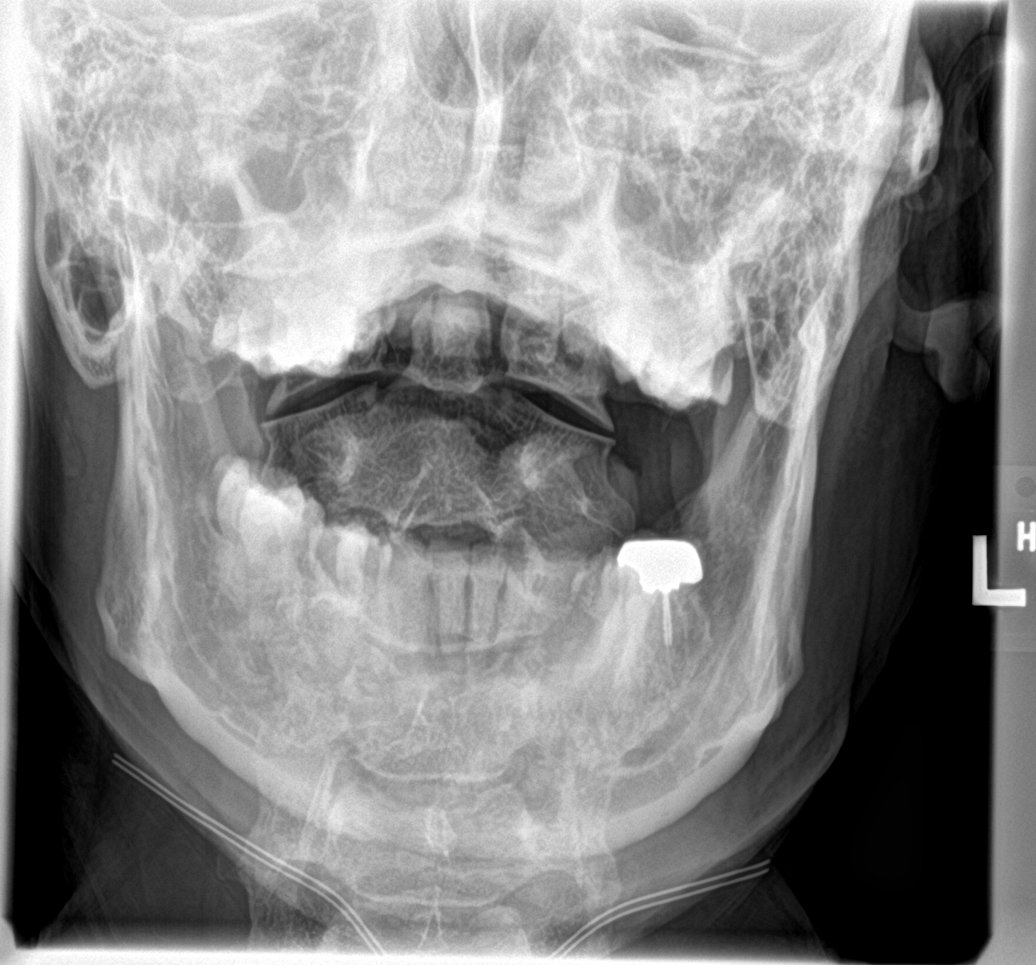

[5 of 5 positions shown; findings below may reference images not displayed]

FINDINGS: Frontal, lateral, open-mouth odontoid, and bilateral oblique views
were obtained. There is no fracture or spondylolisthesis.
Prevertebral soft tissues and predental space regions are normal.
There is minimal disc space narrowing at C5-6. Other disc spaces
appear normal. Anterior osteophyte noted at C 5 inferiorly. There is
slight facet hypertrophy on the left at C5-6. Facets at other levels
appear unremarkable. Lung apices are clear.
IMPRESSION: Slight osteoarthritic change at C5-6. Other disc spaces appear
unremarkable. No fracture or spondylolisthesis.

## 2022-10-12 ENCOUNTER — Ambulatory Visit: Payer: Commercial Managed Care - PPO | Admitting: Student

## 2022-10-24 ENCOUNTER — Ambulatory Visit (INDEPENDENT_AMBULATORY_CARE_PROVIDER_SITE_OTHER): Payer: Commercial Managed Care - PPO | Admitting: Student

## 2022-10-24 ENCOUNTER — Encounter: Payer: Self-pay | Admitting: Student

## 2022-10-24 ENCOUNTER — Other Ambulatory Visit: Payer: Self-pay

## 2022-10-24 VITALS — BP 125/77 | HR 66 | Temp 97.9°F | Resp 28 | Ht 77.0 in | Wt 243.2 lb

## 2022-10-24 DIAGNOSIS — M545 Low back pain, unspecified: Secondary | ICD-10-CM

## 2022-10-24 MED ORDER — METHOCARBAMOL 750 MG PO TABS: 750.0000 mg | ORAL_TABLET | Freq: Three times a day (TID) | ORAL | 0 refills | Status: AC | PRN

## 2022-10-24 NOTE — Patient Instructions (Addendum)
Thank you, [name] for allowing [place] to provide your care today. Today we discussed:  -Appears on exam likely to be muscle spasms of lower back -You can apply a heating pad over lower back to see if it helps -Sent prescription for muscle relaxer called Robaxin 750 mg three times a day as needed (may cause drowsiness). -Can continue taking Advil Dual but make sure to read label about daily dosing. Avoid taking too much of it in 24 hour span.  -Symptoms to be concerned for include but not limited to full leg numbness or numbness in groin, unable to urinate, or stool incontinence.     I have ordered the following medication/changed the following medications:   Stop the following medications: There are no discontinued medications.   Start the following medications: Meds ordered this encounter  Medications   methocarbamol (ROBAXIN-750) 750 MG tablet    Sig: Take 1 tablet (750 mg total) by mouth every 8 (eight) hours as needed for up to 10 days for muscle spasms.    Dispense:  30 tablet    Refill:  0     Follow up:  2-3 weeks    Should you have any questions or concerns please call the internal medicine clinic at [phone].    Rana Snare, D.O. [facility]

## 2022-10-24 NOTE — Assessment & Plan Note (Addendum)
Presents for left sided low back pain around 1.5 months. States was shoveling asphalt and noticed pain started after. Describes as tightness and at times spasms localized to left lumbar paraspinal area. No radiation of pain elsewhere. Denies LE numbness, urinary retention, stool incontinence or saddle paresthesia. Denies urinary symptoms or other systemic symptoms. Has tried Advil Dual twice a day with moderate relief. Hx of laminectomy of L4-5 in 1990s. Noted previous chronic low back pain with radiculopathy of RLE. He denies current pain is not the same as his chronic pain. Exam today consistent with lumbar paraspinal spasms after recent injury. Declined PT referral at this time. Trial muscle relaxer and NSAID for short course with heating pad. If no improvement, may consider imaging given prior hx.   Plan -Robaxin 750 mg TID PRN x 10 days  -Continue NSAID/Tylenol OTC  -Thermotherapy PRN  -F/u in 2-3 weeks if no improvement for further evaluation/imaging

## 2022-10-24 NOTE — Progress Notes (Signed)
   CC: left sided back pain  HPI:  [name] is a 52 y.o. male living with a history stated below and presents today for left sided back pain. Please see problem based assessment and plan for additional details.  Past Medical History:  Diagnosis Date   History of pneumonia 02/12/2015   Lumbar spondylolysis    Surgery in 1992 (Dr. Jettie Booze). MRIs in 2003 and 2004. Neurosurg visit 12/04, no intervention at the time, had negative neuro exam   Neck pain    Normocytic anemia 02/12/2015   Olecranon bursitis, left elbow 01/16/2016    No current outpatient medications on file prior to visit.   No current facility-administered medications on file prior to visit.   Review of Systems: ROS negative except for what is noted on the assessment and plan.  Vitals:   10/24/22 1055  BP: 125/77  Pulse: 66  Resp: (!) 28  Temp: 97.9 F (36.6 C)  TempSrc: Oral  SpO2: 99%  Weight: 243 lb 3.2 oz (110.3 kg)  Height: 6\' 5"  (1.956 m)   Physical Exam: Constitutional: well-appearing male sitting in chair comfortably, in no acute distress Cardiovascular: regular rate and rhythm Pulmonary/Chest: normal work of breathing on room air, lungs clear to auscultation bilaterally MSK: mild TTP of left lumbar paraspinal with noted tightness and spasms, no midline tenderness, no acute deformity, able to ambulate without assistance Neurological: alert & oriented x 3 Skin: warm and dry Psych: pleasant mood  Assessment & Plan:   Low back pain Presents for left sided low back pain around 1.5 months. States was shoveling asphalt and noticed pain started after. Describes as tightness and at times spasms localized to left lumbar paraspinal area. No radiation of pain elsewhere. Denies LE numbness, urinary retention, stool incontinence or saddle paresthesia. Denies urinary symptoms or other systemic symptoms. Has tried Advil Dual twice a day with moderate relief. Hx of laminectomy of L4-5 in 1990s. Noted  previous chronic low back pain with radiculopathy of RLE. He denies current pain is not the same as his chronic pain. Exam today consistent with lumbar paraspinal spasms after recent injury. Declined PT referral at this time. Trial muscle relaxer and NSAID for short course with heating pad. If no improvement, may consider imaging given prior hx.   Plan -Robaxin 750 mg TID PRN x 10 days  -Continue NSAID/Tylenol OTC  -Thermotherapy PRN  -F/u in 2-3 weeks if no improvement for further evaluation/imaging   Patient discussed with Dr. Rollene Fare, D.O. [facility], PGY-2 Phone: [phone] Date 10/24/2022 Time 7:29 PM

## 2022-10-31 ENCOUNTER — Encounter: Payer: Commercial Managed Care - PPO | Admitting: Student

## 2022-10-31 NOTE — Progress Notes (Signed)
Internal Medicine Clinic Attending  Case discussed with the resident at the time of the visit.  We reviewed the resident's history and exam and pertinent patient test results.  I agree with the assessment, diagnosis, and plan of care documented in the resident's note.  

## 2022-11-14 ENCOUNTER — Ambulatory Visit: Payer: Commercial Managed Care - PPO | Admitting: Internal Medicine

## 2022-11-14 ENCOUNTER — Other Ambulatory Visit (HOSPITAL_COMMUNITY): Payer: Self-pay

## 2022-11-14 ENCOUNTER — Encounter: Payer: Self-pay | Admitting: Internal Medicine

## 2022-11-14 ENCOUNTER — Other Ambulatory Visit: Payer: Self-pay

## 2022-11-14 ENCOUNTER — Encounter: Payer: Commercial Managed Care - PPO | Admitting: Student

## 2022-11-14 VITALS — BP 108/65 | HR 68 | Temp 97.5°F | Ht 77.0 in | Wt 243.4 lb

## 2022-11-14 DIAGNOSIS — E663 Overweight: Secondary | ICD-10-CM | POA: Diagnosis not present

## 2022-11-14 DIAGNOSIS — M5116 Intervertebral disc disorders with radiculopathy, lumbar region: Secondary | ICD-10-CM

## 2022-11-14 MED ORDER — GABAPENTIN 800 MG PO TABS
400.0000 mg | ORAL_TABLET | Freq: Three times a day (TID) | ORAL | 2 refills | Status: DC
  Filled 2022-11-14: qty 90, 60d supply, fill #0

## 2022-11-14 NOTE — Patient Instructions (Signed)
Jay Vincent,  It was nice seeing you today! Thank you for choosing [facility] for your Primary Care.    I have sent in a prescription for gabapentin. This is a medication that has helped with your nerve pain in the past. You can take 1/2 tablet (400 mg) three times daily. I recommend starting at night, and taking it during the day for the first time on a day that you do not have work so that you can see how your body responds. If it makes you drowsy, I advise against taking the medication during the day.  I will let you know if your lab results are abnormal.  Please let the clinic know if the pain gets worse or you start to have trouble with urinating or having bowel movements, or if you have incontinence, as we will need to do repeat imaging.   Follow up in 2-4 weeks if this new regimen does not improve your symptoms.  My best, Dr. August Saucer

## 2022-11-14 NOTE — Progress Notes (Unsigned)
CC: low back pain/spasms  HPI:  [name] is a 52 y.o. male with past medical history as detailed below who presents for follow up for his low back pain. Please see problem based charting for detailed assessment and plan.  Past Medical History:  Diagnosis Date   History of pneumonia 02/12/2015   Lumbar spondylolysis    Surgery in 1992 (Dr. Jettie Booze). MRIs in 2003 and 2004. Neurosurg visit 12/04, no intervention at the time, had negative neuro exam   Neck pain    Normocytic anemia 02/12/2015   Olecranon bursitis, left elbow 01/16/2016   Review of Systems:  Negative unless otherwise stated.  Physical Exam:  Vitals:   11/14/22 0852  BP: 108/65  Pulse: 68  Temp: (!) 97.5 F (36.4 C)  TempSrc: Oral  SpO2: 99%  Weight: 243 lb 6.4 oz (110.4 kg)  Height: 6\' 5"  (1.956 m)   Constitutional:Well appearing gentlematn. In no acute distress. Cardio:Regular rate and rhythm. No murmurs, rubs, or gallops. Pulm:Clear to auscultation bilaterally. Normal work of breathing on room air. ZOX:WRUEAVWU for extremity edema. No focal tenderness over vertebrae. No notable paraspinal muscle hypertonicity. Gait normal without evidence of pain. Skin:Warm and dry. Neuro:Alert and oriented x3. No focal deficit noted. Psych:Pleasant mood and affect.  Assessment & Plan:   See Encounters Tab for problem based charting.  Overweight Lipid panel WNL in 2020. His mom recently developed T2DM however no other family history of this or kidney disease. Plan: Lipid panel today.   Lumbar disc disease with radiculopathy Patient complains of ongoing though improved low back pain. He has spasms that occur with forward bending at this time. Initially after assessment at [facility] he was prescribed robaxin 750 mg TID PRN which he was taking up to three times daily, and now is taking it no more than twice daily. He is unable to make it to physical therapy with his current work schedule. He does not perform  stretches at home. He has not been using ibuprofen, tylenol, or heating pads because of improvement of the pain.   He does report ongoing numbness to the lateral aspect of the R leg with standing. There is an associated aching pain. This occurs after prolonged periods of standing. He has not had bowel or bladder retention or incontinence.  Of note he did have an L4-5 laminectomy in the 1990s. He reports that he was supposed to have another surgery in 2011 but it did not happen. The pain he has now is comparable to pain he has previously had.  MRI lumbar spine in 2016 showed: s/p L laminectomy at L4-5 with L paracentral disc protrusion which slightly deforms L aspect of the thecal sac and contacts the L S1 root without compression or displacement.  In the past he has had relief of his pain and neuropathy with gabapentin and tramadol. When his symptoms were severe previously, he would take a day off of work and take these agents which would improve his symptoms enough to return to regular activity. Assessment:I suspect his low back pain is still at least partially related to lumbar paraspinal muscle spasms and that he has radiculopathy, given history of lumbar disc disease with radiculopathy. Fortunately it is not limiting his daily activities. Plan:Will restart gabapentin 400 mg TID. I have advised patient to trial daytime dosing on a non-working day to ensure he isn't overly sedated. No indication for imaging at this time. Stretches provided for patient to try at home.  Patient discussed with Dr.  Mayford Knife

## 2022-11-15 DIAGNOSIS — E663 Overweight: Secondary | ICD-10-CM | POA: Insufficient documentation

## 2022-11-15 NOTE — Assessment & Plan Note (Signed)
Lipid panel WNL in 2020. His mom recently developed T2DM however no other family history of this or kidney disease. Plan: Lipid panel today.

## 2022-11-15 NOTE — Progress Notes (Signed)
Internal Medicine Clinic Attending  Case discussed with the resident at the time of the visit.  We reviewed the resident's history and exam and pertinent patient test results.  I agree with the assessment, diagnosis, and plan of care documented in the resident's note.  

## 2022-11-15 NOTE — Assessment & Plan Note (Signed)
Patient complains of ongoing though improved low back pain. He has spasms that occur with forward bending at this time. Initially after assessment at [facility] he was prescribed robaxin 750 mg TID PRN which he was taking up to three times daily, and now is taking it no more than twice daily. He is unable to make it to physical therapy with his current work schedule. He does not perform stretches at home. He has not been using ibuprofen, tylenol, or heating pads because of improvement of the pain.   He does report ongoing numbness to the lateral aspect of the R leg with standing. There is an associated aching pain. This occurs after prolonged periods of standing. He has not had bowel or bladder retention or incontinence.  Of note he did have an L4-5 laminectomy in the 1990s. He reports that he was supposed to have another surgery in 2011 but it did not happen. The pain he has now is comparable to pain he has previously had.  MRI lumbar spine in 2016 showed: s/p L laminectomy at L4-5 with L paracentral disc protrusion which slightly deforms L aspect of the thecal sac and contacts the L S1 root without compression or displacement.  In the past he has had relief of his pain and neuropathy with gabapentin and tramadol. When his symptoms were severe previously, he would take a day off of work and take these agents which would improve his symptoms enough to return to regular activity. Assessment:I suspect his low back pain is still at least partially related to lumbar paraspinal muscle spasms and that he has radiculopathy, given history of lumbar disc disease with radiculopathy. Fortunately it is not limiting his daily activities. Plan:Will restart gabapentin 400 mg TID. I have advised patient to trial daytime dosing on a non-working day to ensure he isn't overly sedated. No indication for imaging at this time. Stretches provided for patient to try at home.

## 2022-11-16 LAB — LIPID PANEL
Chol/HDL Ratio: 3.6 ratio (ref 0.0–5.0)
Cholesterol, Total: 182 mg/dL (ref 100–199)
HDL: 50 mg/dL (ref >39)
LDL Chol Calc (NIH): 120 mg/dL — ABNORMAL HIGH (ref 0–99)
Triglycerides: 61 mg/dL (ref 0–149)
VLDL Cholesterol Cal: 12 mg/dL (ref 5–40)

## 2022-11-23 ENCOUNTER — Other Ambulatory Visit (HOSPITAL_COMMUNITY): Payer: Self-pay

## 2022-11-26 ENCOUNTER — Other Ambulatory Visit (HOSPITAL_COMMUNITY): Payer: Self-pay

## 2023-11-29 ENCOUNTER — Other Ambulatory Visit: Payer: Self-pay

## 2023-11-29 ENCOUNTER — Emergency Department (HOSPITAL_BASED_OUTPATIENT_CLINIC_OR_DEPARTMENT_OTHER)

## 2023-11-29 ENCOUNTER — Observation Stay (HOSPITAL_BASED_OUTPATIENT_CLINIC_OR_DEPARTMENT_OTHER)
Admission: EM | Admit: 2023-11-29 | Discharge: 2023-12-01 | Disposition: A | Discharge status: Home / Self Care | Attending: Family Medicine | Admitting: Family Medicine

## 2023-11-29 ENCOUNTER — Encounter (HOSPITAL_BASED_OUTPATIENT_CLINIC_OR_DEPARTMENT_OTHER): Payer: Self-pay | Admitting: Emergency Medicine

## 2023-11-29 DIAGNOSIS — I2694 Multiple subsegmental pulmonary emboli without acute cor pulmonale: Principal | ICD-10-CM | POA: Insufficient documentation

## 2023-11-29 DIAGNOSIS — I2699 Other pulmonary embolism without acute cor pulmonale: Secondary | ICD-10-CM | POA: Diagnosis not present

## 2023-11-29 DIAGNOSIS — F1090 Alcohol use, unspecified, uncomplicated: Secondary | ICD-10-CM | POA: Insufficient documentation

## 2023-11-29 DIAGNOSIS — Z87891 Personal history of nicotine dependence: Secondary | ICD-10-CM | POA: Diagnosis not present

## 2023-11-29 DIAGNOSIS — R0789 Other chest pain: Secondary | ICD-10-CM | POA: Diagnosis not present

## 2023-11-29 DIAGNOSIS — R059 Cough, unspecified: Secondary | ICD-10-CM | POA: Diagnosis present

## 2023-11-29 LAB — CBC WITH DIFFERENTIAL/PLATELET
Abs Immature Granulocytes: 0.04 K/uL (ref 0.00–0.07)
Basophils Absolute: 0 K/uL (ref 0.0–0.1)
Basophils Relative: 1 %
Eosinophils Absolute: 0.5 K/uL (ref 0.0–0.5)
Eosinophils Relative: 9 %
HCT: 39.5 % (ref 39.0–52.0)
Hemoglobin: 13.1 g/dL (ref 13.0–17.0)
Immature Granulocytes: 1 %
Lymphocytes Relative: 33 %
Lymphs Abs: 1.9 K/uL (ref 0.7–4.0)
MCH: 30.3 pg (ref 26.0–34.0)
MCHC: 33.2 g/dL (ref 30.0–36.0)
MCV: 91.4 fL (ref 80.0–100.0)
Monocytes Absolute: 0.8 K/uL (ref 0.1–1.0)
Monocytes Relative: 14 %
Neutro Abs: 2.4 K/uL (ref 1.7–7.7)
Neutrophils Relative %: 42 %
Platelets: 224 K/uL (ref 150–400)
RBC: 4.32 MIL/uL (ref 4.22–5.81)
RDW: 12.4 % (ref 11.5–15.5)
WBC: 5.7 K/uL (ref 4.0–10.5)
nRBC: 0 % (ref 0.0–0.2)

## 2023-11-29 LAB — BASIC METABOLIC PANEL WITH GFR
Anion gap: 10 (ref 5–15)
BUN: 13 mg/dL (ref 6–20)
CO2: 26 mmol/L (ref 22–32)
Calcium: 9.1 mg/dL (ref 8.9–10.3)
Chloride: 104 mmol/L (ref 98–111)
Creatinine, Ser: 1.2 mg/dL (ref 0.61–1.24)
GFR, Estimated: >60 mL/min (ref >60)
Glucose, Bld: 91 mg/dL (ref 70–99)
Potassium: 4 mmol/L (ref 3.5–5.1)
Sodium: 141 mmol/L (ref 135–145)

## 2023-11-29 LAB — TROPONIN T, HIGH SENSITIVITY
Troponin T High Sensitivity: 17 ng/L (ref 0–19)
Troponin T High Sensitivity: 17 ng/L (ref 0–19)

## 2023-11-29 LAB — GROUP A STREP BY PCR: Group A Strep by PCR: NOT DETECTED

## 2023-11-29 LAB — RESP PANEL BY RT-PCR (RSV, FLU A&B, COVID)  RVPGX2
Influenza A by PCR: NEGATIVE
Influenza B by PCR: NEGATIVE
Resp Syncytial Virus by PCR: NEGATIVE
SARS Coronavirus 2 by RT PCR: NEGATIVE

## 2023-11-29 LAB — D-DIMER, QUANTITATIVE: D-Dimer, Quant: 1.74 ug{FEU}/mL — ABNORMAL HIGH (ref 0.00–0.50)

## 2023-11-29 MED ORDER — HEPARIN (PORCINE) 25000 UT/250ML-% IV SOLN
2000.0000 [IU]/h | INTRAVENOUS | Status: DC
  Administered 2023-11-29 – 2023-11-30 (×2): 2000 [IU]/h via INTRAVENOUS
  Filled 2023-11-29 (×2): qty 250

## 2023-11-29 MED ORDER — HEPARIN BOLUS VIA INFUSION
6000.0000 [IU] | Freq: Once | INTRAVENOUS | Status: AC
  Administered 2023-11-29: 6000 [IU] via INTRAVENOUS

## 2023-11-29 MED ORDER — IOHEXOL 350 MG/ML SOLN
75.0000 mL | Freq: Once | INTRAVENOUS | Status: AC | PRN
Start: 2023-11-29 — End: 2023-11-29
  Administered 2023-11-29: 75 mL via INTRAVENOUS

## 2023-11-29 MED ORDER — BENZONATATE 100 MG PO CAPS
100.0000 mg | ORAL_CAPSULE | Freq: Once | ORAL | Status: AC
  Administered 2023-11-29: 100 mg via ORAL
  Filled 2023-11-29: qty 1

## 2023-11-29 NOTE — ED Provider Notes (Signed)
 [place] [facility] Provider Note   CSN: [id number] Arrival date & time: 11/29/23  1553     Patient presents with: Cough and Sore Throat   Jay Vincent is a 53 y.o. male.   The history is provided by the patient and medical records. No language interpreter was used.  Cough Sore Throat     53 year old male presenting with cold sxs.  Patient reports since yesterday he has had some pain in his chest, some shortness of breath, occasional cough, some throat irritation.  He does not endorse any fever chills or bodyaches.  Denies any leg swelling or calf pain he denies any prior history of PE or DVT.  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  He denies any productive cough or hemoptysis.  No specific treatment tried at home.  No recent sick contact.  Prior to Admission medications   Medication Sig Start Date End Date Taking? Authorizing Provider  gabapentin  (NEURONTIN ) 800 MG tablet Take 1/2 tablet (400 mg total) by mouth 3 (three) times daily. 11/14/22   Addie Perkins, DO    Allergies: Patient has no known allergies.    Review of Systems  Respiratory:  Positive for cough.   All other systems reviewed and are negative.   Updated Vital Signs BP (!) 153/108 (BP Location: Left Arm)   Pulse 94   Temp 98.8 F (37.1 C)   Resp 18   Ht 6' 5 (1.956 m)   Wt 122.5 kg   SpO2 99%   BMI 32.02 kg/m   Physical Exam Constitutional:      General: He is not in acute distress.    Appearance: He is well-developed.  HENT:     Head: Atraumatic.     Nose: Nose normal.     Mouth/Throat:     Pharynx: Oropharynx is clear.  Eyes:     Conjunctiva/sclera: Conjunctivae normal.  Cardiovascular:     Rate and Rhythm: Normal rate and regular rhythm.     Pulses: Normal pulses.     Heart sounds: Normal heart sounds.  Pulmonary:     Effort: Pulmonary effort is normal.     Breath sounds: Normal breath sounds. No  wheezing, rhonchi or rales.  Abdominal:     Palpations: Abdomen is soft.     Tenderness: There is no abdominal tenderness.  Musculoskeletal:     Cervical back: Normal range of motion and neck supple.     Right lower leg: No edema.     Left lower leg: No edema.  Skin:    Findings: No rash.  Neurological:     Mental Status: He is alert.     (all labs ordered are listed, but only abnormal results are displayed) Labs Reviewed  D-DIMER, QUANTITATIVE - Abnormal; Notable for the following components:      Result Value   D-Dimer, Quant 1.74 (*)    All other components within normal limits  GROUP A STREP BY PCR  RESP PANEL BY RT-PCR (RSV, FLU A&B, COVID)  [id number]  BASIC METABOLIC PANEL WITH GFR  CBC WITH DIFFERENTIAL/PLATELET  HEPARIN  LEVEL (UNFRACTIONATED)  CBC  TROPONIN T, HIGH SENSITIVITY  TROPONIN T, HIGH SENSITIVITY    EKG: None ED ECG REPORT   Date: 11/29/2023  Rate: 73  Rhythm: normal sinus rhythm  QRS Axis: normal  Intervals: normal  ST/T Wave abnormalities: nonspecific ST changes  Conduction Disutrbances:none  Narrative Interpretation:  Old EKG Reviewed: none available  I have personally reviewed the EKG tracing and agree with the computerized printout as noted.   Radiology: CT Angio Chest PE W and/or Wo Contrast Result Date: 11/29/2023 CLINICAL DATA:  Pulmonary embolism (PE) suspected, low to intermediate prob, positive D-dimer EXAM: CT ANGIOGRAPHY CHEST WITH CONTRAST TECHNIQUE: Multidetector CT imaging through the chest was performed using the standard protocol during bolus administration of intravenous contrast. Multiplanar reconstructed images and MIPs were obtained and reviewed to evaluate the vascular anatomy. RADIATION DOSE REDUCTION: This exam was performed according to the departmental dose-optimization program which includes automated exposure control, adjustment of the mA and/or kV according to patient size and/or use of iterative reconstruction technique.  CONTRAST:  75mL OMNIPAQUE  IOHEXOL  350 MG/ML SOLN COMPARISON:  None Available. FINDINGS: Pulmonary Embolism: Distal segmental/subsegmental embolus within the anterior left upper lobe. Distal segmental/subsegmental embolus within the lingula. Subsegmental emboli also noted in the basilar segments of the lower lobes bilaterally. Distal segmental embolus within the inferior right middle lobe with a distal segmental/subsegmental embolus in the anterior right upper lobe. While the RV to LV ratio is just below 1, there is flattening of the intraventricular septum with minimal reflux of contrast into the hepatic veins. Cardiovascular: No cardiomegaly or pericardial effusion. No aortic aneurysm. Mediastinum/Nodes: No mediastinal mass. No mediastinal, hilar, or axillary lymphadenopathy. Lungs/Pleura: The midline trachea and bronchi are patent. Biapical pleuroparenchymal scarring. No focal airspace consolidation, pleural effusion, or pneumothorax. 5 mm nodule associated with the horizontal fissure (axial 74), likely an intra fissural lymph node. Musculoskeletal: No acute fracture or destructive bone lesion. Upper Abdomen: No acute abnormality within the partially visualized upper abdomen. Review of the MIP images confirms the above findings. IMPRESSION: 1. Segmental and subsegmental pulmonary emboli scattered throughout both lungs, as delineated above. While the RV to LV ratio is within normal limits (just below 1), there is flattening of the intraventricular septum with minimal reflux of contrast into the hepatic veins, suggesting elevated pressures and possible right heart strain. A follow-up echocardiogram could be considered for further characterization. 2. No pneumonia, pulmonary edema, or pleural effusion. Critical Value/emergent results were called by telephone at the time of interpretation on 11/29/2023 at 8:16 pm to provider [facility] PA , who verbally acknowledged these results. Electronically Signed   By: Rogelia Myers M.D.   On: 11/29/2023 20:19   DG Chest 2 View Result Date: 11/29/2023 CLINICAL DATA:  Cough and chest discomfort. EXAM: CHEST - 2 VIEW COMPARISON:  09/13/2018 FINDINGS: The cardiomediastinal contours are normal. The lungs are clear. Pulmonary vasculature is normal. No consolidation, pleural effusion, or pneumothorax. No acute osseous abnormalities are seen. IMPRESSION: No active cardiopulmonary disease. Electronically Signed   By: Andrea Gasman M.D.   On: 11/29/2023 19:26     .Critical Care  Performed by: Nivia Colon, PA-C Authorized by: Nivia Colon, PA-C   Critical care provider statement:    Critical care time (minutes):  43   Critical care was time spent personally by me on the following activities:  Development of treatment plan with patient or surrogate, discussions with consultants, evaluation of patient's response to treatment, examination of patient, ordering and review of laboratory studies, ordering and review of radiographic studies, ordering and performing treatments and interventions, pulse oximetry, re-evaluation of patient's condition and review of old charts    Medications Ordered in the ED  heparin  bolus via infusion 6,000 Units (6,000 Units Intravenous Bolus from Bag 11/29/23 2051)    Followed by  heparin  ADULT  infusion 100 units/mL (25000 units/250mL) (2,000 Units/hr Intravenous New Bag/Given 11/29/23 2050)  benzonatate  (TESSALON ) capsule 100 mg (100 mg Oral Given 11/29/23 1825)  iohexol  (OMNIPAQUE ) 350 MG/ML injection 75 mL (75 mLs Intravenous Contrast Given 11/29/23 1933)                                    Medical Decision Making Amount and/or Complexity of Data Reviewed Labs: ordered. Radiology: ordered. ECG/medicine tests: ordered.  Risk Prescription drug management.   BP (!) 153/108 (BP Location: Left Arm)   Pulse 94   Temp 98.8 F (37.1 C)   Resp 18   Ht 6' 5 (1.956 m)   Wt 122.5 kg   SpO2 99%   BMI 32.02 kg/m   79:32 PM   53 year old  male presenting with cold sxs.  Patient reports since yesterday he has had some pain in his chest, some shortness of breath, occasional cough, some throat irritation.  He does not endorse any fever chills or bodyaches.  Denies any leg swelling or calf pain he denies any prior history of PE or DVT.  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  He denies any productive cough or hemoptysis.  No specific treatment tried at home.  No recent sick contact.  On exam patient is resting comfortably appears to be in no acute discomfort.  Ear nose and throat exam unremarkable, heart with normal rate rhythm, lungs clear to auscultation bilaterally abdomen is soft and nontender no peripheral edema noted.  Last imaging obtained independently reviewed and interpreted by me.  Patient tested negative for strep, COVID, flu, RSV.  Chest x-ray without signs of pneumonia.  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  -Labs ordered, independently viewed and interpreted by me.  Labs remarkable for elevated D-dimer of 1.74 therefore will obtain chest CT angiogram to rule out a PE.  Normal troponin.  The remainder of his labs are reassuring. -The patient was maintained on a cardiac monitor.  I personally viewed and interpreted the cardiac monitored which showed an underlying rhythm of: Sinus rhythm -Imaging independently viewed and interpreted by me and I agree with radiologist's interpretation.  Result remarkable for chest CTA showing segmental and subsegmental PE bilaterally.  RV to LV ratio is within normal limit but does show borderline right heart strain.  -This patient presents to the ED for concern of cough, this involves an extensive number of treatment options, and is a complaint that carries with it a high risk of complications and morbidity.  The differential diagnosis  includes covid, flu, rsv, PE, PNA -Co morbidities that complicate the patient evaluation includes none -Treatment includes heparin  -Reevaluation of the patient after these medicines showed that the patient improved -PCP office notes or outside notes reviewed -Discussion with specialist [facility] Dr. Georgina who agrees to admit pt -Escalation to admission/observation considered: patient is agreeable with admission to [facility]     Final diagnoses:  Multiple pulmonary emboli [facility])    ED Discharge Orders     None          Nivia Colon, PA-C 11/29/23 2301    Pamella Ozell LABOR, DO 12/06/23 1128

## 2023-11-29 NOTE — Progress Notes (Addendum)
 PHARMACY - ANTICOAGULATION CONSULT NOTE  Pharmacy Consult for Heparin  Indication: pulmonary embolus  No Known Allergies  Patient Measurements: Height: 6' 5 (195.6 cm) Weight: 122.5 kg (270 lb) IBW/kg (Calculated) : 89.1 HEPARIN  DW (KG): 114.7  Vital Signs: Temp: 98.7 F (37.1 C) (08/29 2007) Temp Source: Oral (08/29 2007) BP: 139/86 (08/29 2007) Pulse Rate: 86 (08/29 2007)  Labs: Recent Labs    11/29/23 1825  HGB 13.1  HCT 39.5  PLT 224  CREATININE 1.20    Estimated Creatinine Clearance: 100.8 mL/min (by C-G formula based on SCr of 1.2 mg/dL).   Medical History: Past Medical History:  Diagnosis Date   History of pneumonia 02/12/2015   Lumbar spondylolysis    Surgery in 1992 (Dr. Cornelius). MRIs in 2003 and 2004. Neurosurg visit 12/04, no intervention at the time, had negative neuro exam   Neck pain    Normocytic anemia 02/12/2015   Olecranon bursitis, left elbow 01/16/2016    Medications:  (Not in a hospital admission)  Scheduled:  Infusions:  PRN:   Assessment: 53 YO M with PMH significant for obesity and lower back pain presents to [facility] ED for cold symptoms. In the ED patient had a CT Angio that was positive for segmental and subsegmental pulmonary emboli scattered throughout both lungs. Pharmacy consulted for Heparin  for PE management.  Hgb 13.1, Hct 39.5, Plt 224  Goal of Therapy:  Heparin  level 0.3-0.7 units/ml Monitor platelets by anticoagulation protocol: Yes   Plan:  Give 6000 units bolus x 1 Start heparin  infusion at 2000 units/hr Check anti-Xa level in 6 hours and daily while on heparin  Continue to monitor H&H and platelets  R. Samual Satterfield, PharmD PGY-1 Acute Care Pharmacy Resident [facility] 11/29/2023 8:28 PM

## 2023-11-29 NOTE — ED Notes (Signed)
 ED TO INPATIENT HANDOFF REPORT  ED Nurse Name and Phone #: Sharlet Lemmings RN [phone]  S Name/Age/Gender Jay Vincent 53 y.o. male Room/Bed: MH12/MH12  Code Status   Code Status: Not on file  Home/SNF/Other Home Patient oriented to: self, place, time, and situation Is this baseline? Yes   Triage Complete: Triage complete  Chief Complaint Acute pulmonary embolism (HCC) [I26.99]  Triage Note Pt reports cough, sore throat, and ShoB x 2d, unsure of fever; denies any respiratory hx  Pt in nad, RT called to triage to assess   Allergies No Known Allergies  Level of Care/Admitting Diagnosis ED Disposition     ED Disposition  Admit   Condition  --   Comment  Hospital Area: [facility] [[id number]]  Level of Care: Med-Surg [16]  May admit patient to [facility] or [facility] if equivalent level of care is available:: No  Interfacility transfer: Yes  Covid Evaluation: Asymptomatic - no recent exposure (last 10 days) testing not required  Diagnosis: Acute pulmonary embolism [facility]) [[id number]]  Admitting Physician: GEORGINA BASKET [[id number]]  Attending Physician: GEORGINA BASKET [[id number]]  Certification:: I certify this patient will need inpatient services for at least 2 midnights  Expected Medical Readiness: 12/02/2023          B Medical/Surgery History Past Medical History:  Diagnosis Date   History of pneumonia 02/12/2015   Lumbar spondylolysis    Surgery in 1992 (Dr. Cornelius). MRIs in 2003 and 2004. Neurosurg visit 12/04, no intervention at the time, had negative neuro exam   Neck pain    Normocytic anemia 02/12/2015   Olecranon bursitis, left elbow 01/16/2016   Past Surgical History:  Procedure Laterality Date   BACK SURGERY     COLONOSCOPY  12/19/2018   FOOT FRACTURE SURGERY Right 1991     A IV Location/Drains/Wounds Patient Lines/Drains/Airways Status     Active Line/Drains/Airways     Name Placement date Placement time Site Days    Peripheral IV 11/29/23 20 G 1 Anterior;Distal;Right;Upper Antecubital 11/29/23  1824  Antecubital  less than 1            Intake/Output Last 24 hours No intake or output data in the 24 hours ending 11/29/23 2322  Labs/Imaging Results for orders placed or performed during the hospital encounter of 11/29/23 (from the past 48 hours)  Group A Strep by PCR if patient complains of sore throat.     Status: None   Collection Time: 11/29/23  4:08 PM   Specimen: Throat; Sterile Swab  Result Value Ref Range   Group A Strep by PCR NOT DETECTED NOT DETECTED    Comment: Performed at [facility], [address] [place], [place] [address]  Resp panel by RT-PCR (RSV, Flu A&B, Covid) Throat     Status: None   Collection Time: 11/29/23  4:08 PM   Specimen: Throat; Nasal Swab  Result Value Ref Range   SARS Coronavirus 2 by RT PCR NEGATIVE NEGATIVE    Comment: (NOTE) SARS-CoV-2 target nucleic acids are NOT DETECTED.  The SARS-CoV-2 RNA is generally detectable in upper respiratory specimens during the acute phase of infection. The lowest concentration of SARS-CoV-2 viral copies this assay can detect is 138 copies/mL. A negative result does not preclude SARS-Cov-2 infection and should not be used as the sole basis for treatment or other patient management decisions. A negative result may occur with  improper specimen collection/handling, submission of specimen other than nasopharyngeal swab, presence of  viral mutation(s) within the areas targeted by this assay, and inadequate number of viral copies(<138 copies/mL). A negative result must be combined with clinical observations, patient history, and epidemiological information. The expected result is Negative.  Fact Sheet for Patients:  BloggerCourse.com  Fact Sheet for Healthcare Providers:  SeriousBroker.it  This test is no t yet approved or cleared by the [place]  FDA and   has been authorized for detection and/or diagnosis of SARS-CoV-2 by FDA under an Emergency Use Authorization (EUA). This EUA will remain  in effect (meaning this test can be used) for the duration of the COVID-19 declaration under Section 564(b)(1) of the Act, 21 U.S.C.section 360bbb-3(b)(1), unless the authorization is terminated  or revoked sooner.       Influenza A by PCR NEGATIVE NEGATIVE   Influenza B by PCR NEGATIVE NEGATIVE    Comment: (NOTE) The Xpert Xpress SARS-CoV-2/FLU/RSV plus assay is intended as an aid in the diagnosis of influenza from Nasopharyngeal swab specimens and should not be used as a sole basis for treatment. Nasal washings and aspirates are unacceptable for Xpert Xpress SARS-CoV-2/FLU/RSV testing.  Fact Sheet for Patients: BloggerCourse.com  Fact Sheet for Healthcare Providers: SeriousBroker.it  This test is not yet approved or cleared by the [place]  FDA and has been authorized for detection and/or diagnosis of SARS-CoV-2 by FDA under an Emergency Use Authorization (EUA). This EUA will remain in effect (meaning this test can be used) for the duration of the COVID-19 declaration under Section 564(b)(1) of the Act, 21 U.S.C. section 360bbb-3(b)(1), unless the authorization is terminated or revoked.     Resp Syncytial Virus by PCR NEGATIVE NEGATIVE    Comment: (NOTE) Fact Sheet for Patients: BloggerCourse.com  Fact Sheet for Healthcare Providers: SeriousBroker.it  This test is not yet approved or cleared by the [place]  FDA and has been authorized for detection and/or diagnosis of SARS-CoV-2 by FDA under an Emergency Use Authorization (EUA). This EUA will remain in effect (meaning this test can be used) for the duration of the COVID-19 declaration under Section 564(b)(1) of the Act, 21 U.S.C. section 360bbb-3(b)(1), unless the  authorization is terminated or revoked.  Performed at [facility], [address] [place], [place] [address]   Basic metabolic panel     Status: None   Collection Time: 11/29/23  6:25 PM  Result Value Ref Range   Sodium 141 135 - 145 mmol/L   Potassium 4.0 3.5 - 5.1 mmol/L   Chloride 104 98 - 111 mmol/L   CO2 26 22 - 32 mmol/L   Glucose, Bld 91 70 - 99 mg/dL    Comment: Glucose reference range applies only to samples taken after fasting for at least 8 hours.   BUN 13 6 - 20 mg/dL   Creatinine, Ser 8.79 0.61 - 1.24 mg/dL   Calcium 9.1 8.9 - 89.6 mg/dL   GFR, Estimated >39 >39 mL/min    Comment: (NOTE) Calculated using the CKD-EPI Creatinine Equation (2021)    Anion gap 10 5 - 15    Comment: Performed at [facility], [address] [place], [place] [address]  CBC with Differential     Status: None   Collection Time: 11/29/23  6:25 PM  Result Value Ref Range   WBC 5.7 4.0 - 10.5 K/uL   RBC 4.32 4.22 - 5.81 MIL/uL   Hemoglobin 13.1 13.0 - 17.0 g/dL   HCT 60.4 60.9 - 47.9 %   MCV 91.4 80.0 - 100.0  fL   MCH 30.3 26.0 - 34.0 pg   MCHC 33.2 30.0 - 36.0 g/dL   RDW 87.5 88.4 - 84.4 %   Platelets 224 150 - 400 K/uL   nRBC 0.0 0.0 - 0.2 %   Neutrophils Relative % 42 %   Neutro Abs 2.4 1.7 - 7.7 K/uL   Lymphocytes Relative 33 %   Lymphs Abs 1.9 0.7 - 4.0 K/uL   Monocytes Relative 14 %   Monocytes Absolute 0.8 0.1 - 1.0 K/uL   Eosinophils Relative 9 %   Eosinophils Absolute 0.5 0.0 - 0.5 K/uL   Basophils Relative 1 %   Basophils Absolute 0.0 0.0 - 0.1 K/uL   Immature Granulocytes 1 %   Abs Immature Granulocytes 0.04 0.00 - 0.07 K/uL    Comment: Performed at [facility], [address] [place], [place] [address]  D-dimer, quantitative     Status: Abnormal   Collection Time: 11/29/23  6:25 PM  Result Value Ref Range   D-Dimer, Quant 1.74 (H) 0.00 - 0.50 ug/mL-FEU    Comment: (NOTE) At the manufacturer cut-off value of 0.5 g/mL FEU, this  assay has a negative predictive value of 95-100%.This assay is intended for use in conjunction with a clinical pretest probability (PTP) assessment model to exclude pulmonary embolism (PE) and deep venous thrombosis (DVT) in outpatients suspected of PE or DVT. Results should be correlated with clinical presentation. Performed at [facility], [address] [place], [place] [address]   Troponin T, High Sensitivity     Status: None   Collection Time: 11/29/23  8:37 PM  Result Value Ref Range   Troponin T High Sensitivity 17 0 - 19 ng/L    Comment: (NOTE) Biotin concentrations > 1000 ng/mL falsely decrease TnT results.  Serial cardiac troponin measurements are suggested.  Refer to the Links section for chest pain algorithms and additional  guidance. Performed at [facility], [address] [place], [place] [address]    CT Angio Chest PE W and/or Wo Contrast Result Date: 11/29/2023 CLINICAL DATA:  Pulmonary embolism (PE) suspected, low to intermediate prob, positive D-dimer EXAM: CT ANGIOGRAPHY CHEST WITH CONTRAST TECHNIQUE: Multidetector CT imaging through the chest was performed using the standard protocol during bolus administration of intravenous contrast. Multiplanar reconstructed images and MIPs were obtained and reviewed to evaluate the vascular anatomy. RADIATION DOSE REDUCTION: This exam was performed according to the departmental dose-optimization program which includes automated exposure control, adjustment of the mA and/or kV according to patient size and/or use of iterative reconstruction technique. CONTRAST:  75mL OMNIPAQUE  IOHEXOL  350 MG/ML SOLN COMPARISON:  None Available. FINDINGS: Pulmonary Embolism: Distal segmental/subsegmental embolus within the anterior left upper lobe. Distal segmental/subsegmental embolus within the lingula. Subsegmental emboli also noted in the basilar segments of the lower lobes bilaterally. Distal segmental embolus within the  inferior right middle lobe with a distal segmental/subsegmental embolus in the anterior right upper lobe. While the RV to LV ratio is just below 1, there is flattening of the intraventricular septum with minimal reflux of contrast into the hepatic veins. Cardiovascular: No cardiomegaly or pericardial effusion. No aortic aneurysm. Mediastinum/Nodes: No mediastinal mass. No mediastinal, hilar, or axillary lymphadenopathy. Lungs/Pleura: The midline trachea and bronchi are patent. Biapical pleuroparenchymal scarring. No focal airspace consolidation, pleural effusion, or pneumothorax. 5 mm nodule associated with the horizontal fissure (axial 74), likely an intra fissural lymph node. Musculoskeletal: No acute fracture or destructive bone lesion. Upper Abdomen: No  acute abnormality within the partially visualized upper abdomen. Review of the MIP images confirms the above findings. IMPRESSION: 1. Segmental and subsegmental pulmonary emboli scattered throughout both lungs, as delineated above. While the RV to LV ratio is within normal limits (just below 1), there is flattening of the intraventricular septum with minimal reflux of contrast into the hepatic veins, suggesting elevated pressures and possible right heart strain. A follow-up echocardiogram could be considered for further characterization. 2. No pneumonia, pulmonary edema, or pleural effusion. Critical Value/emergent results were called by telephone at the time of interpretation on 11/29/2023 at 8:16 pm to provider [facility] , who verbally acknowledged these results. Electronically Signed   By: Rogelia Myers M.D.   On: 11/29/2023 20:19   DG Chest 2 View Result Date: 11/29/2023 CLINICAL DATA:  Cough and chest discomfort. EXAM: CHEST - 2 VIEW COMPARISON:  09/13/2018 FINDINGS: The cardiomediastinal contours are normal. The lungs are clear. Pulmonary vasculature is normal. No consolidation, pleural effusion, or pneumothorax. No acute osseous abnormalities are  seen. IMPRESSION: No active cardiopulmonary disease. Electronically Signed   By: Andrea Gasman M.D.   On: 11/29/2023 19:26    Pending Labs Unresulted Labs (From admission, onward)     Start     Ordered   11/30/23 0300  Heparin  level (unfractionated)  ONCE - URGENT,   URGENT        11/29/23 2030   11/30/23 0300  CBC  Tomorrow morning,   R        11/29/23 2247            Vitals/Pain Today's Vitals   11/29/23 1601 11/29/23 1602 11/29/23 1730 11/29/23 2007  BP:    139/86  Pulse:    86  Resp:    18  Temp:    98.7 F (37.1 C)  TempSrc:    Oral  SpO2:    97%  Weight:  122.5 kg    Height:  6' 5 (1.956 m)    PainSc: 6   0-No pain     Isolation Precautions No active isolations  Medications Medications  heparin  bolus via infusion 6,000 Units (6,000 Units Intravenous Bolus from Bag 11/29/23 2051)    Followed by  heparin  ADULT infusion 100 units/mL (25000 units/250mL) (2,000 Units/hr Intravenous New Bag/Given 11/29/23 2050)  benzonatate  (TESSALON ) capsule 100 mg (100 mg Oral Given 11/29/23 1825)  iohexol  (OMNIPAQUE ) 350 MG/ML injection 75 mL (75 mLs Intravenous Contrast Given 11/29/23 1933)    Mobility walks     Focused Assessments Pulmonary Assessment Handoff:  Lung sounds: Bilateral Breath Sounds: Clear L Breath Sounds: Clear R Breath Sounds: Clear O2 Device: Room Air      R Recommendations: See Admitting Provider Note  Report given to:   Additional Notes: Heparin  infusion 20 ml/hr ( received 6000unit bolus)

## 2023-11-29 NOTE — ED Triage Notes (Addendum)
 Pt reports cough, sore throat, and ShoB x 2d, unsure of fever; denies any respiratory hx  Pt in nad, RT called to triage to assess

## 2023-11-29 NOTE — ED Notes (Signed)
 [facility] has been called. Receiving department is aware of patient. Spoke to [employer], Charity fundraiser from Department. Update given.

## 2023-11-29 NOTE — ED Notes (Signed)
 [facility] called for transport.

## 2023-11-29 NOTE — Plan of Care (Signed)
 Hospital Medicine Transfer Accept Note Patient Name/Age: Jay Vincent / 53 y.o. MRN: [id number] Admission Date: 11/29/2023  Once successfully transferred to the appropriate floor, TRH will assume care for the patient above.  A/P: 38M p/w cp/SOB and found to have acute PE. On hep gtt. EDP requested admission for TTE to eval for R heart strain.

## 2023-11-30 ENCOUNTER — Inpatient Hospital Stay (HOSPITAL_BASED_OUTPATIENT_CLINIC_OR_DEPARTMENT_OTHER)

## 2023-11-30 DIAGNOSIS — I2699 Other pulmonary embolism without acute cor pulmonale: Secondary | ICD-10-CM | POA: Diagnosis not present

## 2023-11-30 DIAGNOSIS — I2609 Other pulmonary embolism with acute cor pulmonale: Secondary | ICD-10-CM | POA: Diagnosis not present

## 2023-11-30 LAB — ECHOCARDIOGRAM COMPLETE
Area-P 1/2: 3.77 cm2
Height: 77 in
S' Lateral: 3.42 cm
Weight: 4320 [oz_av]

## 2023-11-30 LAB — RETICULOCYTES
Immature Retic Fract: 15 % (ref 2.3–15.9)
RBC.: 4.47 MIL/uL (ref 4.22–5.81)
Retic Count, Absolute: 78.2 K/uL (ref 19.0–186.0)
Retic Ct Pct: 1.8 % (ref 0.4–3.1)

## 2023-11-30 LAB — CBC
HCT: 38.4 % — ABNORMAL LOW (ref 39.0–52.0)
Hemoglobin: 12.7 g/dL — ABNORMAL LOW (ref 13.0–17.0)
MCH: 29.9 pg (ref 26.0–34.0)
MCHC: 33.1 g/dL (ref 30.0–36.0)
MCV: 90.4 fL (ref 80.0–100.0)
Platelets: 232 K/uL (ref 150–400)
RBC: 4.25 MIL/uL (ref 4.22–5.81)
RDW: 12.7 % (ref 11.5–15.5)
WBC: 5.9 K/uL (ref 4.0–10.5)
nRBC: 0 % (ref 0.0–0.2)

## 2023-11-30 LAB — IRON AND TIBC
Iron: 61 ug/dL (ref 45–182)
Saturation Ratios: 22 % (ref 17.9–39.5)
TIBC: 273 ug/dL (ref 250–450)
UIBC: 212 ug/dL

## 2023-11-30 LAB — HEPARIN LEVEL (UNFRACTIONATED): Heparin Unfractionated: >1.1 [IU]/mL — ABNORMAL HIGH (ref 0.30–0.70)

## 2023-11-30 LAB — FERRITIN: Ferritin: 617 ng/mL — ABNORMAL HIGH (ref 24–336)

## 2023-11-30 LAB — HIV ANTIBODY (ROUTINE TESTING W REFLEX): HIV Screen 4th Generation wRfx: NONREACTIVE

## 2023-11-30 LAB — VITAMIN B12: Vitamin B-12: 757 pg/mL (ref 180–914)

## 2023-11-30 LAB — FOLATE: Folate: 9.4 ng/mL (ref >5.9)

## 2023-11-30 MED ORDER — ONDANSETRON HCL 4 MG/2ML IJ SOLN: 4.0000 mg | Freq: Four times a day (QID) | INTRAMUSCULAR | Status: DC | PRN

## 2023-11-30 MED ORDER — PERFLUTREN LIPID MICROSPHERE
1.0000 mL | INTRAVENOUS | Status: AC | PRN
  Administered 2023-11-30: 3 mL via INTRAVENOUS

## 2023-11-30 MED ORDER — HEPARIN (PORCINE) 25000 UT/250ML-% IV SOLN
1800.0000 [IU]/h | INTRAVENOUS | Status: DC
  Administered 2023-11-30: 1800 [IU]/h via INTRAVENOUS

## 2023-11-30 MED ORDER — APIXABAN 5 MG PO TABS: 5.0000 mg | ORAL_TABLET | Freq: Two times a day (BID) | ORAL | Status: DC

## 2023-11-30 MED ORDER — APIXABAN 5 MG PO TABS
10.0000 mg | ORAL_TABLET | Freq: Two times a day (BID) | ORAL | Status: DC
  Administered 2023-11-30 – 2023-12-01 (×3): 10 mg via ORAL
  Filled 2023-11-30 (×3): qty 2

## 2023-11-30 MED ORDER — ONDANSETRON HCL 4 MG PO TABS: 4.0000 mg | ORAL_TABLET | Freq: Four times a day (QID) | ORAL | Status: DC | PRN

## 2023-11-30 MED ORDER — ACETAMINOPHEN 325 MG PO TABS: 650.0000 mg | ORAL_TABLET | Freq: Four times a day (QID) | ORAL | Status: DC | PRN

## 2023-11-30 MED ORDER — ACETAMINOPHEN 650 MG RE SUPP: 650.0000 mg | Freq: Four times a day (QID) | RECTAL | Status: DC | PRN

## 2023-11-30 NOTE — Progress Notes (Signed)
 PHARMACY - ANTICOAGULATION CONSULT NOTE  Pharmacy Consult for Heparin  Indication: pulmonary embolus  No Known Allergies  Patient Measurements: Height: 6' 5 (195.6 cm) Weight: 122.5 kg (270 lb) IBW/kg (Calculated) : 89.1 HEPARIN  DW (KG): 114.7  Vital Signs: Temp: 98.7 F (37.1 C) (08/30 0354) Temp Source: Oral (08/30 0354) BP: 146/94 (08/30 0730) Pulse Rate: 67 (08/30 0730)  Labs: Recent Labs    11/29/23 1825 11/30/23 0414 11/30/23 0637  HGB 13.1 12.7*  --   HCT 39.5 38.4*  --   PLT 224 232  --   HEPARINUNFRC  --   --  >1.10*  CREATININE 1.20  --   --     Estimated Creatinine Clearance: 100.8 mL/min (by C-G formula based on SCr of 1.2 mg/dL).   Assessment: 53 YO M with PMH significant for obesity and lower back pain presents [facility] ED for cold symptoms. In the ED patient had a CT Angio that was positive for segmental and subsegmental pulmonary emboli scattered throughout both lungs. Pharmacy consulted for Heparin  for PE management.  Initial heparin  level elevated.  Lab drawn appropriately and no bleeding observed per discussion with RN.  Goal of Therapy:  Heparin  level 0.3-0.7 units/ml Monitor platelets by anticoagulation protocol: Yes   Plan:  Hold IV heparin  x 1 hr, then restart at 1800 units/hr Check 6 hr heparin  level Daily heparin  level and CBC  Smt. Loder D. Lendell, PharmD, BCPS, BCCCP 11/30/2023, 9:18 AM

## 2023-11-30 NOTE — Progress Notes (Signed)
*  PRELIMINARY RESULTS* Echocardiogram 2D Echocardiogram has been performed with Definity .  Jay Vincent 11/30/2023, 2:48 PM

## 2023-11-30 NOTE — H&P (Signed)
 History and Physical    JARRID LIENHARD FMW:[id number] DOB: May 09, 1970 DOA: 11/29/2023  PCP: Elicia Sharper, DO  Patient coming from: [facility]  I have personally briefly reviewed patient's old medical records in [facility]  Chief Complaint: Chest pain and shortness of breath  HPI: Jay Vincent is a 53 y.o. 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  He denied any fever, chills, sweating, any problem with urination or with bowel movement.  Patient tells me that he has been having shortness of breath intermittently since 2015 but it has gotten worse and he is not feeling any better than yesterday.  Although he appears very comfortable.  He has no other complaint at the moment.  No chest pain.  ED Course: Hemodynamically stable upon arrival to ED, further workup revealed bilateral segmental and subsegmental PE with concern for right heart strain.  Also CBC showed mild anemia which was normocytic and stable compared to previous labs.  Review of Systems: As per HPI otherwise negative.    Past Medical History:  Diagnosis Date   History of pneumonia 02/12/2015   Lumbar spondylolysis    Surgery in 1992 (Dr. Cornelius). MRIs in 2003 and 2004. Neurosurg visit 12/04, no intervention at the time, had negative neuro exam   Neck pain    Normocytic anemia 02/12/2015   Olecranon bursitis, left elbow 01/16/2016    Past Surgical History:  Procedure Laterality Date   BACK SURGERY     COLONOSCOPY  12/19/2018   FOOT FRACTURE SURGERY Right 1991     reports that he quit smoking about 5 years ago. His smoking use included cigars. He has never used smokeless tobacco. He reports current alcohol use. He reports that he does not use drugs.  No Known Allergies  Family History  Problem Relation Age of Onset    Colon cancer Neg Hx    Colon polyps Neg Hx    Esophageal cancer Neg Hx    Rectal cancer Neg Hx    Stomach cancer Neg Hx     Prior to Admission medications   Medication Sig Start Date End Date Taking? Authorizing Provider  ibuprofen  (ADVIL ) 200 MG tablet Take 800 mg by mouth daily as needed for mild pain (pain score 1-3).   Yes [provider]  gabapentin  (NEURONTIN ) 800 MG tablet Take 1/2 tablet (400 mg total) by mouth 3 (three) times daily. Patient not taking: Reported on 11/30/2023 11/14/22   Addie Perkins, DO    Physical Exam: Vitals:   11/30/23 0652 11/30/23 0730 11/30/23 0800 11/30/23 1000  BP:  (!) 146/94  (!) 125/114  Pulse:  67  75  Resp:  14  19  Temp:    98 F (36.7 C)  TempSrc:    Oral  SpO2: 95% 97% 98% 98%  Weight:      Height:        Constitutional: NAD, calm, comfortable Vitals:   11/30/23 0652 11/30/23 0730 11/30/23 0800 11/30/23 1000  BP:  (!) 146/94  (!) 125/114  Pulse:  67  75  Resp:  14  19  Temp:    98 F (36.7 C)  TempSrc:    Oral  SpO2: 95% 97% 98% 98%  Weight:      Height:       Eyes: PERRL,  lids and conjunctivae normal ENMT: Mucous membranes are moist. Posterior pharynx clear of any exudate or lesions.Normal dentition.  Neck: normal, supple, no masses, no thyromegaly Respiratory: clear to auscultation bilaterally, no wheezing, no crackles. Normal respiratory effort. No accessory muscle use.  Cardiovascular: Regular rate and rhythm, no murmurs / rubs / gallops. No extremity edema. 2+ pedal pulses. No carotid bruits.  Abdomen: no tenderness, no masses palpated. No hepatosplenomegaly. Bowel sounds positive.  Musculoskeletal: no clubbing / cyanosis. No joint deformity upper and lower extremities. Good ROM, no contractures. Normal muscle tone.  Skin: no rashes, lesions, ulcers. No induration Neurologic: CN 2-12 grossly intact. Sensation intact, DTR normal. Strength 5/5 in all 4.  Psychiatric: Normal judgment and insight. Alert and oriented x  3. Normal mood.    Labs on Admission: I have personally reviewed following labs and imaging studies  CBC: Recent Labs  Lab 11/29/23 1825 11/30/23 0414  WBC 5.7 5.9  NEUTROABS 2.4  --   HGB 13.1 12.7*  HCT 39.5 38.4*  MCV 91.4 90.4  PLT 224 232   Basic Metabolic Panel: Recent Labs  Lab 11/29/23 1825  NA 141  K 4.0  CL 104  CO2 26  GLUCOSE 91  BUN 13  CREATININE 1.20  CALCIUM 9.1   GFR: Estimated Creatinine Clearance: 100.8 mL/min (by C-G formula based on SCr of 1.2 mg/dL). Liver Function Tests: No results for input(s): AST, ALT, ALKPHOS, BILITOT, PROT, ALBUMIN in the last 168 hours. No results for input(s): LIPASE, AMYLASE in the last 168 hours. No results for input(s): AMMONIA in the last 168 hours. Coagulation Profile: No results for input(s): INR, PROTIME in the last 168 hours. Cardiac Enzymes: No results for input(s): CKTOTAL, CKMB, CKMBINDEX, TROPONINI in the last 168 hours. BNP (last 3 results) No results for input(s): PROBNP in the last 8760 hours. HbA1C: No results for input(s): HGBA1C in the last 72 hours. CBG: No results for input(s): GLUCAP in the last 168 hours. Lipid Profile: No results for input(s): CHOL, HDL, LDLCALC, TRIG, CHOLHDL, LDLDIRECT in the last 72 hours. Thyroid Function Tests: No results for input(s): TSH, T4TOTAL, FREET4, T3FREE, THYROIDAB in the last 72 hours. Anemia Panel: No results for input(s): VITAMINB12, FOLATE, FERRITIN, TIBC, IRON, RETICCTPCT in the last 72 hours. Urine analysis:    Component Value Date/Time   COLORURINE ORANGE (A) 12/14/2013 2254   APPEARANCEUR Clear 02/11/2015 1630   LABSPEC 1.034 (H) 12/14/2013 2254   PHURINE 6.0 12/14/2013 2254   GLUCOSEU Negative 02/11/2015 1630   HGBUR TRACE (A) 12/14/2013 2254   BILIRUBINUR Negative 02/11/2015 1630   KETONESUR 15 (A) 12/14/2013 2254   PROTEINUR Negative 02/11/2015 1630   PROTEINUR 100 (A)  12/14/2013 2254   UROBILINOGEN 2.0 (H) 12/14/2013 2254   NITRITE Negative 02/11/2015 1630   NITRITE NEGATIVE 12/14/2013 2254   LEUKOCYTESUR Negative 02/11/2015 1630    Radiological Exams on Admission: CT Angio Chest PE W and/or Wo Contrast Result Date: 11/29/2023 CLINICAL DATA:  Pulmonary embolism (PE) suspected, low to intermediate prob, positive D-dimer EXAM: CT ANGIOGRAPHY CHEST WITH CONTRAST TECHNIQUE: Multidetector CT imaging through the chest was performed using the standard protocol during bolus administration of intravenous contrast. Multiplanar reconstructed images and MIPs were obtained and reviewed to evaluate the vascular anatomy. RADIATION DOSE REDUCTION: This exam was performed according to the departmental dose-optimization program which includes automated exposure control, adjustment of the mA and/or kV according to patient size and/or use of iterative reconstruction technique. CONTRAST:  75mL OMNIPAQUE  IOHEXOL  350 MG/ML SOLN COMPARISON:  None Available. FINDINGS: Pulmonary Embolism: Distal segmental/subsegmental embolus within the anterior left upper lobe. Distal segmental/subsegmental embolus within the lingula. Subsegmental emboli also noted in the basilar segments of the lower lobes bilaterally. Distal segmental embolus within the inferior right middle lobe with a distal segmental/subsegmental embolus in the anterior right upper lobe. While the RV to LV ratio is just below 1, there is flattening of the intraventricular septum with minimal reflux of contrast into the hepatic veins. Cardiovascular: No cardiomegaly or pericardial effusion. No aortic aneurysm. Mediastinum/Nodes: No mediastinal mass. No mediastinal, hilar, or axillary lymphadenopathy. Lungs/Pleura: The midline trachea and bronchi are patent. Biapical pleuroparenchymal scarring. No focal airspace consolidation, pleural effusion, or pneumothorax. 5 mm nodule associated with the horizontal fissure (axial 74), likely an intra  fissural lymph node. Musculoskeletal: No acute fracture or destructive bone lesion. Upper Abdomen: No acute abnormality within the partially visualized upper abdomen. Review of the MIP images confirms the above findings. IMPRESSION: 1. Segmental and subsegmental pulmonary emboli scattered throughout both lungs, as delineated above. While the RV to LV ratio is within normal limits (just below 1), there is flattening of the intraventricular septum with minimal reflux of contrast into the hepatic veins, suggesting elevated pressures and possible right heart strain. A follow-up echocardiogram could be considered for further characterization. 2. No pneumonia, pulmonary edema, or pleural effusion. Critical Value/emergent results were called by telephone at the time of interpretation on 11/29/2023 at 8:16 pm to provider [facility] , who verbally acknowledged these results. Electronically Signed   By: Rogelia Myers M.D.   On: 11/29/2023 20:19   DG Chest 2 View Result Date: 11/29/2023 CLINICAL DATA:  Cough and chest discomfort. EXAM: CHEST - 2 VIEW COMPARISON:  09/13/2018 FINDINGS: The cardiomediastinal contours are normal. The lungs are clear. Pulmonary vasculature is normal. No consolidation, pleural effusion, or pneumothorax. No acute osseous abnormalities are seen. IMPRESSION: No active cardiopulmonary disease. Electronically Signed   By: Andrea Gasman M.D.   On: 11/29/2023 19:26    EKG: Independently reviewed.  Sinus rhythm with no acute ST-T wave changes.  Assessment/Plan Principal Problem:   Acute pulmonary embolism (HCC)   Bilateral PE: Segmental and subsegmental.  Patient complains of exertional shortness of breath but appears comfortable.  CTA chest showed right heart strain.  Patient was admitted per ED physician request for echo.  I have ordered echo with imminent discharge as well as bilateral lower extremity Doppler for imminent discharge as well.  Patient denies any leg pain, no calf  tenderness.  Patient is on heparin , I will transition him to Eliquis .  I discussed advantages and disadvantages of anticoagulation with the patient and he is in agreement with the plan.  Normocytic anemia: Patient has mild anemia and this has been going on since 2016 per the records.  Will check iron studies, B12 and folate.  DVT prophylaxis:  Code Status: Full code Family Communication: None present at bedside.  Plan of care discussed with patient in length and he verbalized understanding and agreed with it. Disposition Plan: Patient may discharge later today if all the workup is completed or early morning tomorrow. Consults called: None  Fredia Skeeter MD [facility]  *Please note that this is a verbal dictation therefore any spelling or grammatical errors are due to the Dragon Medical One system interpretation.  Please page via Amion and do not message via secure chat for urgent patient care matters. Secure chat can be used for non urgent patient care matters. 11/30/2023, 10:41 AM  To contact the attending provider between 7A-7P or the covering provider during after hours 7P-7A, please log into the web site [email or web address]

## 2023-11-30 NOTE — ED Notes (Signed)
 [facility] main pharmacy noted to stop the running infusion at this time for 1 hour and restart at lower rate.  No bleeding noted.  [facility] is aware. Will secure chat receiving RN at this time.

## 2023-11-30 NOTE — Plan of Care (Signed)

## 2023-12-01 ENCOUNTER — Inpatient Hospital Stay (HOSPITAL_COMMUNITY)

## 2023-12-01 DIAGNOSIS — I2699 Other pulmonary embolism without acute cor pulmonale: Secondary | ICD-10-CM | POA: Diagnosis present

## 2023-12-01 LAB — CBC
HCT: 37.7 % — ABNORMAL LOW (ref 39.0–52.0)
Hemoglobin: 12.3 g/dL — ABNORMAL LOW (ref 13.0–17.0)
MCH: 30.1 pg (ref 26.0–34.0)
MCHC: 32.6 g/dL (ref 30.0–36.0)
MCV: 92.2 fL (ref 80.0–100.0)
Platelets: 218 K/uL (ref 150–400)
RBC: 4.09 MIL/uL — ABNORMAL LOW (ref 4.22–5.81)
RDW: 12.5 % (ref 11.5–15.5)
WBC: 4.2 K/uL (ref 4.0–10.5)
nRBC: 0 % (ref 0.0–0.2)

## 2023-12-01 LAB — BASIC METABOLIC PANEL WITH GFR
Anion gap: 8 (ref 5–15)
BUN: 12 mg/dL (ref 6–20)
CO2: 24 mmol/L (ref 22–32)
Calcium: 8.9 mg/dL (ref 8.9–10.3)
Chloride: 106 mmol/L (ref 98–111)
Creatinine, Ser: 1.04 mg/dL (ref 0.61–1.24)
GFR, Estimated: >60 mL/min (ref >60)
Glucose, Bld: 102 mg/dL — ABNORMAL HIGH (ref 70–99)
Potassium: 4 mmol/L (ref 3.5–5.1)
Sodium: 138 mmol/L (ref 135–145)

## 2023-12-01 MED ORDER — APIXABAN (ELIQUIS) VTE STARTER PACK (10MG AND 5MG)
ORAL_TABLET | ORAL | 0 refills | Status: AC
Start: 2023-12-01 — End: ?

## 2023-12-01 NOTE — Progress Notes (Signed)
 Pt has order for LE Venous. Per Dr. Vernon, Fredia via secure chat with primary RN. He can go home without ultrasound.   Will start discharge process

## 2023-12-01 NOTE — Care Management (Signed)
 Provided patient with 30 day and copay reduction cards for Eliquis , explained how to use. No other TOC needs identified.

## 2023-12-01 NOTE — Discharge Summary (Signed)
 Physician Discharge Summary  LYSANDER CALIXTE FMW:[id number] DOB: [birth year] DOA: 11/29/2023  PCP: Elicia Sharper, DO  Admit date: 11/29/2023 Discharge date: 12/01/2023 30 Day Unplanned Readmission Risk Score    Flowsheet Row ED to Hosp-Admission (Current) from 11/29/2023 in [facility]  30 Day Unplanned Readmission Risk Score (%) 5.79 Filed at 12/01/2023 0801    This score is the patient's risk of an unplanned readmission within 30 days of being discharged (0 -100%). The score is based on dignosis, age, lab data, medications, orders, and past utilization.   Low:  0-14.9   Medium: 15-21.9   High: 22-29.9   Extreme: 30 and above          Admitted From: Home Disposition: Home  Recommendations for Outpatient Follow-up:  Follow up with PCP in 1-2 weeks Please obtain BMP/CBC in one week Please follow up with your PCP on the following pending results: Unresulted Labs (From admission, onward)    None         Home Health: None Equipment/Devices: None  Discharge Condition: Stable CODE STATUS: Full code Diet recommendation:  Diet Order             Diet regular Room service appropriate? Yes; Fluid consistency: Thin  Diet effective now                 Due to brief hospitalization, I have copied HPI and ED course as below.  HPI: Jay Vincent is a 53 y.o. 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  He denied any fever, chills, sweating, any problem with urination or with bowel movement.  Patient tells me that he has been having shortness of breath intermittently since 2015 but it has gotten worse and he is not feeling any better than yesterday.  Although he appears very comfortable.  He has no other complaint at the moment.  No chest pain.   ED Course: Hemodynamically stable  upon arrival to ED, further workup revealed bilateral segmental and subsegmental PE with concern for right heart strain.  Also CBC showed mild anemia which was normocytic and stable compared to previous labs.  Subjective: Seen and examined, wife at the bedside.  He is feeling better.  He is in agreement with going home today.  Brief/Interim Summary: Patient was briefly hospitalized overnight for PE.  Echo showed normal ejection fraction and no diastolic dysfunction.  Right ventricular systolic function was normal with minimal septal flattening and mildly dilated RVOT but patient is asymptomatic.  Patient was transitioned yesterday from heparin  to Eliquis .  I had a lengthy discussion with the patient yesterday about anticoagulation, risks and benefits and he verbalized understanding and we had the similar conversation again today and pharmacy also spoke to the patient.  He understands that he will be at high risk of bleeding for any trauma so he needs to take it easy.  Continue anticoagulation for at least 6 months.  Follow-up with PCP.  Avoid NSAIDs to prevent bleeding.  Discharge plan was discussed with patient and/or family member and they verbalized understanding and agreed with it.  Discharge Diagnoses:  Principal Problem:   Acute pulmonary embolism [facility]) Active Problems:   Pulmonary embolism [facility])    Discharge Instructions   Allergies as of 12/01/2023   No Known Allergies  Medication List     STOP taking these medications    ibuprofen  200 MG tablet Commonly known as: ADVIL        TAKE these medications    Apixaban  Starter Pack (10mg  and 5mg ) Commonly known as: ELIQUIS  STARTER PACK Take as directed on package: start with two-5mg  tablets twice daily for 7 days. On day 8, switch to one-5mg  tablet twice daily.   gabapentin  800 MG tablet Commonly known as: NEURONTIN  Take 1/2 tablet (400 mg total) by mouth 3 (three) times daily.        Follow-up Information      Elicia Sharper, DO Follow up in 1 week(s).   Specialty: Internal Medicine Contact information: [address] [place], [address] [place] [address] [phone]                No Known Allergies  Consultations: None   Procedures/Studies: ECHOCARDIOGRAM COMPLETE Result Date: 11/30/2023    ECHOCARDIOGRAM REPORT   Patient Name:   Jay Vincent Date of Exam: 11/30/2023 Medical Rec #:  [id number]         Height:       77.0 in Accession #:    [id number]        Weight:       270.0 lb Date of Birth:  05/28/1970         BSA:          2.541 m Patient Age:    53 years          BP:           131/85 mmHg Patient Gender: M                 HR:           79 bpm. Exam Location:  Inpatient Procedure: 2D Echo, Cardiac Doppler, Color Doppler and Intracardiac            Opacification Agent (Both Spectral and Color Flow Doppler were            utilized during procedure). Indications:    Pulmonary Embolus I26.09  History:        Patient has no prior history of Echocardiogram examinations.                 Risk Factors:Current Smoker.  Sonographer:    Aida Pizza RCS Referring Phys: [id number] Darien Kading IMPRESSIONS  1. Left ventricular ejection fraction, by estimation, is 55 to 60%. The left ventricle has normal function. The left ventricle has no regional wall motion abnormalities. Left ventricular diastolic parameters were normal.  2. Right ventricular systolic function was not well visualized- miminal septal flattening and basal function is preserved. The right ventricular size is normal but the RVOT appears mildly dilated.  3. The mitral valve is normal in structure. No evidence of mitral valve regurgitation. No evidence of mitral stenosis.  4. The aortic valve is tricuspid. Aortic valve regurgitation is not visualized. No aortic stenosis is present.  5. The inferior vena cava is normal in size with greater than 50% respiratory variability, suggesting right atrial pressure of 3 mmHg. Comparison(s): No prior  Echocardiogram. FINDINGS  Left Ventricle: Left ventricular ejection fraction, by estimation, is 55 to 60%. The left ventricle has normal function. The left ventricle has no regional wall motion abnormalities. Definity  contrast agent was given IV to delineate the left ventricular  endocardial borders. The left ventricular internal cavity size was normal in size. There is no left ventricular hypertrophy. Left ventricular  diastolic parameters were normal. Right Ventricle: The right ventricular size is normal. No increase in right ventricular wall thickness. Right ventricular systolic function was not well visualized. Left Atrium: Left atrial size was normal in size. Right Atrium: Right atrial size was normal in size. Pericardium: There is no evidence of pericardial effusion. Mitral Valve: The mitral valve is normal in structure. No evidence of mitral valve regurgitation. No evidence of mitral valve stenosis. Tricuspid Valve: The tricuspid valve is normal in structure. Tricuspid valve regurgitation is mild . No evidence of tricuspid stenosis. Aortic Valve: The aortic valve is tricuspid. Aortic valve regurgitation is not visualized. No aortic stenosis is present. Pulmonic Valve: The pulmonic valve was normal in structure. Pulmonic valve regurgitation is trivial. No evidence of pulmonic stenosis. Aorta: The aortic root and ascending aorta are structurally normal, with no evidence of dilitation. Pulmonary Artery: The pulmonary artery is not well seen. Venous: The inferior vena cava is normal in size with greater than 50% respiratory variability, suggesting right atrial pressure of 3 mmHg. IAS/Shunts: The atrial septum is grossly normal.  LEFT VENTRICLE PLAX 2D LVIDd:         5.05 cm   Diastology LVIDs:         3.42 cm   LV e' medial:    7.40 cm/s LV PW:         0.88 cm   LV E/e' medial:  8.1 LV IVS:        1.04 cm   LV e' lateral:   13.70 cm/s LVOT diam:     2.30 cm   LV E/e' lateral: 4.4 LV SV:         75 LV SV Index:    30 LVOT Area:     4.15 cm  RIGHT VENTRICLE RV S prime:     10.10 cm/s TAPSE (M-mode): 2.7 cm LEFT ATRIUM             Index        RIGHT ATRIUM           Index LA diam:        3.36 cm 1.32 cm/m   RA Area:     18.10 cm LA Vol (A2C):   37.1 ml 14.60 ml/m  RA Volume:   53.50 ml  21.05 ml/m LA Vol (A4C):   52.8 ml 20.78 ml/m LA Biplane Vol: 47.9 ml 18.85 ml/m  AORTIC VALVE LVOT Vmax:   85.80 cm/s LVOT Vmean:  54.300 cm/s LVOT VTI:    0.181 m  AORTA Ao Root diam: 3.54 cm MITRAL VALVE MV Area (PHT): 3.77 cm    SHUNTS MV Decel Time: 201 msec    Systemic VTI:  0.18 m MV E velocity: 60.10 cm/s  Systemic Diam: 2.30 cm MV A velocity: 54.80 cm/s MV E/A ratio:  1.10 Stanly Leavens MD Electronically signed by Stanly Leavens MD Signature Date/Time: 11/30/2023/3:19:01 PM    Final    CT Angio Chest PE W and/or Wo Contrast Result Date: 11/29/2023 CLINICAL DATA:  Pulmonary embolism (PE) suspected, low to intermediate prob, positive D-dimer EXAM: CT ANGIOGRAPHY CHEST WITH CONTRAST TECHNIQUE: Multidetector CT imaging through the chest was performed using the standard protocol during bolus administration of intravenous contrast. Multiplanar reconstructed images and MIPs were obtained and reviewed to evaluate the vascular anatomy. RADIATION DOSE REDUCTION: This exam was performed according to the departmental dose-optimization program which includes automated exposure control, adjustment of the mA and/or kV according to patient size and/or use of iterative reconstruction  technique. CONTRAST:  75mL OMNIPAQUE  IOHEXOL  350 MG/ML SOLN COMPARISON:  None Available. FINDINGS: Pulmonary Embolism: Distal segmental/subsegmental embolus within the anterior left upper lobe. Distal segmental/subsegmental embolus within the lingula. Subsegmental emboli also noted in the basilar segments of the lower lobes bilaterally. Distal segmental embolus within the inferior right middle lobe with a distal segmental/subsegmental embolus in the  anterior right upper lobe. While the RV to LV ratio is just below 1, there is flattening of the intraventricular septum with minimal reflux of contrast into the hepatic veins. Cardiovascular: No cardiomegaly or pericardial effusion. No aortic aneurysm. Mediastinum/Nodes: No mediastinal mass. No mediastinal, hilar, or axillary lymphadenopathy. Lungs/Pleura: The midline trachea and bronchi are patent. Biapical pleuroparenchymal scarring. No focal airspace consolidation, pleural effusion, or pneumothorax. 5 mm nodule associated with the horizontal fissure (axial 74), likely an intra fissural lymph node. Musculoskeletal: No acute fracture or destructive bone lesion. Upper Abdomen: No acute abnormality within the partially visualized upper abdomen. Review of the MIP images confirms the above findings. IMPRESSION: 1. Segmental and subsegmental pulmonary emboli scattered throughout both lungs, as delineated above. While the RV to LV ratio is within normal limits (just below 1), there is flattening of the intraventricular septum with minimal reflux of contrast into the hepatic veins, suggesting elevated pressures and possible right heart strain. A follow-up echocardiogram could be considered for further characterization. 2. No pneumonia, pulmonary edema, or pleural effusion. Critical Value/emergent results were called by telephone at the time of interpretation on 11/29/2023 at 8:16 pm to provider [facility] , who verbally acknowledged these results. Electronically Signed   By: Rogelia Myers M.D.   On: 11/29/2023 20:19   DG Chest 2 View Result Date: 11/29/2023 CLINICAL DATA:  Cough and chest discomfort. EXAM: CHEST - 2 VIEW COMPARISON:  09/13/2018 FINDINGS: The cardiomediastinal contours are normal. The lungs are clear. Pulmonary vasculature is normal. No consolidation, pleural effusion, or pneumothorax. No acute osseous abnormalities are seen. IMPRESSION: No active cardiopulmonary disease. Electronically Signed   By:  Andrea Gasman M.D.   On: 11/29/2023 19:26     Discharge Exam: Vitals:   12/01/23 0349 12/01/23 0828  BP: 135/78 117/84  Pulse: 66 71  Resp: 16   Temp: 97.9 F (36.6 C) 98 F (36.7 C)  SpO2: 97% 98%   Vitals:   11/30/23 1955 11/30/23 2341 12/01/23 0349 12/01/23 0828  BP: 138/87 131/87 135/78 117/84  Pulse: 73 89 66 71  Resp: 16 16 16    Temp: 98.4 F (36.9 C) 97.7 F (36.5 C) 97.9 F (36.6 C) 98 F (36.7 C)  TempSrc: Oral Oral Oral Oral  SpO2: 97% 97% 97% 98%  Weight:      Height:        General: Pt is alert, awake, not in acute distress Cardiovascular: RRR, S1/S2 +, no rubs, no gallops Respiratory: CTA bilaterally, no wheezing, no rhonchi Abdominal: Soft, NT, ND, bowel sounds + Extremities: no edema, no cyanosis    The results of significant diagnostics from this hospitalization (including imaging, microbiology, ancillary and laboratory) are listed below for reference.     Microbiology: Recent Results (from the past 240 hours)  Group A Strep by PCR if patient complains of sore throat.     Status: None   Collection Time: 11/29/23  4:08 PM   Specimen: Throat; Sterile Swab  Result Value Ref Range Status   Group A Strep by PCR NOT DETECTED NOT DETECTED Final    Comment: Performed at [facility], [address] [place], [place] [address]  Resp panel by RT-PCR (RSV, Flu A&B, Covid) Throat     Status: None   Collection Time: 11/29/23  4:08 PM   Specimen: Throat; Nasal Swab  Result Value Ref Range Status   SARS Coronavirus 2 by RT PCR NEGATIVE NEGATIVE Final    Comment: (NOTE) SARS-CoV-2 target nucleic acids are NOT DETECTED.  The SARS-CoV-2 RNA is generally detectable in upper respiratory specimens during the acute phase of infection. The lowest concentration of SARS-CoV-2 viral copies this assay can detect is 138 copies/mL. A negative result does not preclude SARS-Cov-2 infection and should not be used as the sole basis for treatment or other  patient management decisions. A negative result may occur with  improper specimen collection/handling, submission of specimen other than nasopharyngeal swab, presence of viral mutation(s) within the areas targeted by this assay, and inadequate number of viral copies(<138 copies/mL). A negative result must be combined with clinical observations, patient history, and epidemiological information. The expected result is Negative.  Fact Sheet for Patients:  BloggerCourse.com  Fact Sheet for Healthcare Providers:  SeriousBroker.it  This test is no t yet approved or cleared by the [place]  FDA and  has been authorized for detection and/or diagnosis of SARS-CoV-2 by FDA under an Emergency Use Authorization (EUA). This EUA will remain  in effect (meaning this test can be used) for the duration of the COVID-19 declaration under Section 564(b)(1) of the Act, 21 U.S.C.section 360bbb-3(b)(1), unless the authorization is terminated  or revoked sooner.       Influenza A by PCR NEGATIVE NEGATIVE Final   Influenza B by PCR NEGATIVE NEGATIVE Final    Comment: (NOTE) The Xpert Xpress SARS-CoV-2/FLU/RSV plus assay is intended as an aid in the diagnosis of influenza from Nasopharyngeal swab specimens and should not be used as a sole basis for treatment. Nasal washings and aspirates are unacceptable for Xpert Xpress SARS-CoV-2/FLU/RSV testing.  Fact Sheet for Patients: BloggerCourse.com  Fact Sheet for Healthcare Providers: SeriousBroker.it  This test is not yet approved or cleared by the [place]  FDA and has been authorized for detection and/or diagnosis of SARS-CoV-2 by FDA under an Emergency Use Authorization (EUA). This EUA will remain in effect (meaning this test can be used) for the duration of the COVID-19 declaration under Section 564(b)(1) of the Act, 21 U.S.C. section  360bbb-3(b)(1), unless the authorization is terminated or revoked.     Resp Syncytial Virus by PCR NEGATIVE NEGATIVE Final    Comment: (NOTE) Fact Sheet for Patients: BloggerCourse.com  Fact Sheet for Healthcare Providers: SeriousBroker.it  This test is not yet approved or cleared by the [place]  FDA and has been authorized for detection and/or diagnosis of SARS-CoV-2 by FDA under an Emergency Use Authorization (EUA). This EUA will remain in effect (meaning this test can be used) for the duration of the COVID-19 declaration under Section 564(b)(1) of the Act, 21 U.S.C. section 360bbb-3(b)(1), unless the authorization is terminated or revoked.  Performed at [facility], [address] [place], [place] [address]      Labs: BNP (last 3 results) No results for input(s): BNP in the last 8760 hours. Basic Metabolic Panel: Recent Labs  Lab 11/29/23 1825 12/01/23 0547  NA 141 138  K 4.0 4.0  CL 104 106  CO2 26 24  GLUCOSE 91 102*  BUN 13 12  CREATININE 1.20 1.04  CALCIUM 9.1 8.9   Liver Function Tests: No results for input(s): AST, ALT, ALKPHOS,  BILITOT, PROT, ALBUMIN in the last 168 hours. No results for input(s): LIPASE, AMYLASE in the last 168 hours. No results for input(s): AMMONIA in the last 168 hours. CBC: Recent Labs  Lab 11/29/23 1825 11/30/23 0414 12/01/23 0547  WBC 5.7 5.9 4.2  NEUTROABS 2.4  --   --   HGB 13.1 12.7* 12.3*  HCT 39.5 38.4* 37.7*  MCV 91.4 90.4 92.2  PLT 224 232 218   Cardiac Enzymes: No results for input(s): CKTOTAL, CKMB, CKMBINDEX, TROPONINI in the last 168 hours. BNP: Invalid input(s): POCBNP CBG: No results for input(s): GLUCAP in the last 168 hours. D-Dimer Recent Labs    11/29/23 1825  DDIMER 1.74*   Hgb A1c No results for input(s): HGBA1C in the last 72 hours. Lipid Profile No results for input(s): CHOL, HDL,  LDLCALC, TRIG, CHOLHDL, LDLDIRECT in the last 72 hours. Thyroid function studies No results for input(s): TSH, T4TOTAL, T3FREE, THYROIDAB in the last 72 hours.  Invalid input(s): FREET3 Anemia work up Recent Labs    11/30/23 1235  VITAMINB12 757  FOLATE 9.4  FERRITIN 617*  TIBC 273  IRON 61  RETICCTPCT 1.8   Urinalysis    Component Value Date/Time   COLORURINE ORANGE (A) 12/14/2013 2254   APPEARANCEUR Clear 02/11/2015 1630   LABSPEC 1.034 (H) 12/14/2013 2254   PHURINE 6.0 12/14/2013 2254   GLUCOSEU Negative 02/11/2015 1630   HGBUR TRACE (A) 12/14/2013 2254   BILIRUBINUR Negative 02/11/2015 1630   KETONESUR 15 (A) 12/14/2013 2254   PROTEINUR Negative 02/11/2015 1630   PROTEINUR 100 (A) 12/14/2013 2254   UROBILINOGEN 2.0 (H) 12/14/2013 2254   NITRITE Negative 02/11/2015 1630   NITRITE NEGATIVE 12/14/2013 2254   LEUKOCYTESUR Negative 02/11/2015 1630   Sepsis Labs Recent Labs  Lab 11/29/23 1825 11/30/23 0414 12/01/23 0547  WBC 5.7 5.9 4.2   Microbiology Recent Results (from the past 240 hours)  Group A Strep by PCR if patient complains of sore throat.     Status: None   Collection Time: 11/29/23  4:08 PM   Specimen: Throat; Sterile Swab  Result Value Ref Range Status   Group A Strep by PCR NOT DETECTED NOT DETECTED Final    Comment: Performed at [facility], [address] [place], [place] [address]  Resp panel by RT-PCR (RSV, Flu A&B, Covid) Throat     Status: None   Collection Time: 11/29/23  4:08 PM   Specimen: Throat; Nasal Swab  Result Value Ref Range Status   SARS Coronavirus 2 by RT PCR NEGATIVE NEGATIVE Final    Comment: (NOTE) SARS-CoV-2 target nucleic acids are NOT DETECTED.  The SARS-CoV-2 RNA is generally detectable in upper respiratory specimens during the acute phase of infection. The lowest concentration of SARS-CoV-2 viral copies this assay can detect is 138 copies/mL. A negative result does not preclude  SARS-Cov-2 infection and should not be used as the sole basis for treatment or other patient management decisions. A negative result may occur with  improper specimen collection/handling, submission of specimen other than nasopharyngeal swab, presence of viral mutation(s) within the areas targeted by this assay, and inadequate number of viral copies(<138 copies/mL). A negative result must be combined with clinical observations, patient history, and epidemiological information. The expected result is Negative.  Fact Sheet for Patients:  BloggerCourse.com  Fact Sheet for Healthcare Providers:  SeriousBroker.it  This test is no t yet approved or cleared by the [place]  FDA and  has been authorized for detection and/or  diagnosis of SARS-CoV-2 by FDA under an Emergency Use Authorization (EUA). This EUA will remain  in effect (meaning this test can be used) for the duration of the COVID-19 declaration under Section 564(b)(1) of the Act, 21 U.S.C.section 360bbb-3(b)(1), unless the authorization is terminated  or revoked sooner.       Influenza A by PCR NEGATIVE NEGATIVE Final   Influenza B by PCR NEGATIVE NEGATIVE Final    Comment: (NOTE) The Xpert Xpress SARS-CoV-2/FLU/RSV plus assay is intended as an aid in the diagnosis of influenza from Nasopharyngeal swab specimens and should not be used as a sole basis for treatment. Nasal washings and aspirates are unacceptable for Xpert Xpress SARS-CoV-2/FLU/RSV testing.  Fact Sheet for Patients: BloggerCourse.com  Fact Sheet for Healthcare Providers: SeriousBroker.it  This test is not yet approved or cleared by the [place]  FDA and has been authorized for detection and/or diagnosis of SARS-CoV-2 by FDA under an Emergency Use Authorization (EUA). This EUA will remain in effect (meaning this test can be used) for the duration of  the COVID-19 declaration under Section 564(b)(1) of the Act, 21 U.S.C. section 360bbb-3(b)(1), unless the authorization is terminated or revoked.     Resp Syncytial Virus by PCR NEGATIVE NEGATIVE Final    Comment: (NOTE) Fact Sheet for Patients: BloggerCourse.com  Fact Sheet for Healthcare Providers: SeriousBroker.it  This test is not yet approved or cleared by the [place]  FDA and has been authorized for detection and/or diagnosis of SARS-CoV-2 by FDA under an Emergency Use Authorization (EUA). This EUA will remain in effect (meaning this test can be used) for the duration of the COVID-19 declaration under Section 564(b)(1) of the Act, 21 U.S.C. section 360bbb-3(b)(1), unless the authorization is terminated or revoked.  Performed at [facility], [address] [place], [place] [address]     FURTHER DISCHARGE INSTRUCTIONS:   Get Medicines reviewed and adjusted: Please take all your medications with you for your next visit with your Primary MD   Laboratory/radiological data: Please request your Primary MD to go over all hospital tests and procedure/radiological results at the follow up, please ask your Primary MD to get all Hospital records sent to his/her office.   In some cases, they will be blood work, cultures and biopsy results pending at the time of your discharge. Please request that your primary care M.D. goes through all the records of your hospital data and follows up on these results.   Also Note the following: If you experience worsening of your admission symptoms, develop shortness of breath, life threatening emergency, suicidal or homicidal thoughts you must seek medical attention immediately by calling 911 or calling your MD immediately  if symptoms less severe.   You must read complete instructions/literature along with all the possible adverse reactions/side effects for all the Medicines you  take and that have been prescribed to you. Take any new Medicines after you have completely understood and accpet all the possible adverse reactions/side effects.    patient was instructed, not to drive, operate heavy machinery, perform activities at heights, swimming or participation in water activities or provide baby-sitting services while on Pain, Sleep and Anxiety Medications; until their outpatient Physician has advised to do so again. Also recommended to not to take more than prescribed Pain, Sleep and Anxiety Medications.  It is not advisable to combine anxiety, sleep and pain medications without talking with your primary care provider.     Wear Seat belts while driving.   Please note: You  were cared for by a hospitalist during your hospital stay. Once you are discharged, your primary care physician will handle any further medical issues. Please note that NO REFILLS for any discharge medications will be authorized once you are discharged, as it is imperative that you return to your primary care physician (or establish a relationship with a primary care physician if you do not have one) for your post hospital discharge needs so that they can reassess your need for medications and monitor your lab values  Time coordinating discharge: Over 30 minutes  SIGNED:   Fredia Skeeter, MD  [facility] 12/01/2023, 9:02 AM *Please note that this is a verbal dictation therefore any spelling or grammatical errors are due to the Dragon Medical One system interpretation. If 7PM-7AM, please contact night-coverage [email or web address]

## 2023-12-01 NOTE — Progress Notes (Signed)
 Pt verbalized understanding of discharge POC.  Patient received 2 packs of discount cards for eliquis .  Primary RN removed PIV and Tele.  Patient discharge with wife.

## 2023-12-01 NOTE — Progress Notes (Signed)
 Pt and wife ambulated off unit.  Pt discharged home in stable condition with all belongings.

## 2023-12-02 ENCOUNTER — Telehealth (INDEPENDENT_AMBULATORY_CARE_PROVIDER_SITE_OTHER): Payer: Self-pay

## 2023-12-02 NOTE — Transitions of Care (Post Inpatient/ED Visit) (Signed)
   12/02/2023  Name: Jay Vincent MRN: [id number] DOB: Dec 17, 1970  Today's TOC FU Call Status: Today's TOC FU Call Status:: Successful TOC FU Call Completed TOC FU Call Complete Date: 12/02/23 Patient's Name and Date of Birth confirmed.  Transition Care Management Follow-up Telephone Call Date of Discharge: 12/01/23 Discharge Facility: [facility]) Type of Discharge: Inpatient Admission Primary Inpatient Discharge Diagnosis:: PE How have you been since you were released from the hospital?: Better Any questions or concerns?: No  Items Reviewed: Did you receive and understand the discharge instructions provided?: Yes Medications obtained,verified, and reconciled?: Yes (Medications Reviewed) Any new allergies since your discharge?: No Dietary orders reviewed?: Yes Do you have support at home?: Yes People in Home [RPT]: spouse  Medications Reviewed Today: Medications Reviewed Today     Reviewed by Emmitt Pan, LPN (Licensed Practical Nurse) on 12/02/23 at 1130  Med List Status: <None>   Medication Order Taking? Sig Documenting Provider Last Dose Status Informant  APIXABAN  (ELIQUIS ) VTE STARTER PACK (10MG  AND 5MG ) [id number] Yes Take as directed on package: start with two-5mg  tablets twice daily for 7 days. On day 8, switch to one-5mg  tablet twice daily. Vernon Ranks, MD  Active   gabapentin  (NEURONTIN ) 800 MG tablet [id number]  Take 1/2 tablet (400 mg total) by mouth 3 (three) times daily.  Patient not taking: Reported on 12/02/2023   Addie Perkins, DO  Active Self, Pharmacy Records            Home Care and Equipment/Supplies: Were Home Health Services Ordered?: NA Any new equipment or medical supplies ordered?: NA  Functional Questionnaire: Do you need assistance with bathing/showering or dressing?: No Do you need assistance with meal preparation?: No Do you need assistance with eating?: No Do you have difficulty maintaining continence: No Do you need assistance  with getting out of bed/getting out of a chair/moving?: No Do you have difficulty managing or taking your medications?: No  Follow up appointments reviewed: PCP Follow-up appointment confirmed?: No (sent message to staff to schedule) MD Provider Line Number:[phone] Given: No Specialist Hospital Follow-up appointment confirmed?: NA Do you need transportation to your follow-up appointment?: No Do you understand care options if your condition(s) worsen?: Yes-patient verbalized understanding    SIGNATURE Pan Emmitt, LPN [facility] Nurse Health Advisor Direct Dial [phone]

## 2023-12-09 ENCOUNTER — Ambulatory Visit (INDEPENDENT_AMBULATORY_CARE_PROVIDER_SITE_OTHER): Admitting: Student

## 2023-12-09 ENCOUNTER — Encounter: Admitting: Student

## 2023-12-09 ENCOUNTER — Encounter: Payer: Self-pay | Admitting: Student

## 2023-12-09 ENCOUNTER — Other Ambulatory Visit: Payer: Self-pay

## 2023-12-09 VITALS — BP 131/76 | HR 75 | Temp 97.8°F | Ht 77.0 in | Wt 242.2 lb

## 2023-12-09 DIAGNOSIS — I2699 Other pulmonary embolism without acute cor pulmonale: Secondary | ICD-10-CM | POA: Diagnosis not present

## 2023-12-09 DIAGNOSIS — I2782 Chronic pulmonary embolism: Secondary | ICD-10-CM

## 2023-12-09 DIAGNOSIS — E785 Hyperlipidemia, unspecified: Secondary | ICD-10-CM

## 2023-12-09 MED ORDER — APIXABAN 5 MG PO TABS
5.0000 mg | ORAL_TABLET | Freq: Two times a day (BID) | ORAL | 2 refills | Status: DC
  Filled 2023-12-09: qty 60, 30d supply, fill #0
  Filled 2024-02-13: qty 60, 30d supply, fill #1
  Filled 2024-03-24: qty 60, 30d supply, fill #2

## 2023-12-09 NOTE — Progress Notes (Addendum)
   CC: Hospital Follow up   HPI:  [name] is a 53 y.o.-year-old male with a past medical history of PE diagnosed on 11/29/2023 and discharged on Eliquis  presenting for hospital follow-up. Recently hospitalized from 08/30-08/31 for PE and discharged on Eliquis . Please see assessment and plan for full HPI.  Medications: PE: Eliquis  5 mg twice daily  Past Medical History:  Diagnosis Date   History of pneumonia 02/12/2015   Lumbar spondylolysis    Surgery in 1992 (Dr. Cornelius). MRIs in 2003 and 2004. Neurosurg visit 12/04, no intervention at the time, had negative neuro exam   Neck pain    Normocytic anemia 02/12/2015   Olecranon bursitis, left elbow 01/16/2016     Current Outpatient Medications:    apixaban  (ELIQUIS ) 5 MG TABS tablet, Take 1 tablet (5 mg total) by mouth 2 (two) times daily., Disp: 60 tablet, Rfl: 2   APIXABAN  (ELIQUIS ) VTE STARTER PACK (10MG  AND 5MG ), Take as directed on package: start with two-5mg  tablets twice daily for 7 days. On day 8, switch to one-5mg  tablet twice daily., Disp: 74 each, Rfl: 0  Review of Systems:    Respiratory: Patient endorses intermittent shortness of breath Cardiovascular: Patient denies any chest pain  Physical Exam:  Vitals:   12/09/23 0826  BP: 131/76  Pulse: 75  Temp: 97.8 F (36.6 C)  TempSrc: Oral  SpO2: 98%  Weight: 242 lb 3.2 oz (109.9 kg)  Height: 6' 5 (1.956 m)   General: Patient is sitting comfortably in the room  Head: Normocephalic, atraumatic  Cardio: Regular rate and rhythm, no murmurs, rubs or gallops Pulmonary: Clear to ausculation bilaterally with no rales, rhonchi, and crackles   Extremities: Bilateral lower extremities with no edema appreciated   Assessment & Plan:   Pulmonary embolism [facility]) Patient recently hospitalized for Bilateral subsegmental and segmental pulmonary emboli with some concern for right heart strain with mild RVOT dilatation.  Patient was hospitalized from 08/30-08/31  and discharged with apixaban .  Patient has been adherent to his apixaban  regimen.  He reports having some shortness of breath but nothing to concerning per the patient.  He denies any chest pain.  He is up-to-date on all his cancer screening.  His last colonoscopy was 5 years ago, and at that time recommended 10 years afterwards.  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  He otherwise works in [employer].  He is active with riding ATVs and scuba diving.  Recommended avoiding scuba diving at this time.  Will defer hypercoagulable workup at this time.  At this time we will treat with apixaban  and likely due to ongoing risk factor of truck driving, will probably need indefinite treatment. Discussed this with patient today, and we will continue to have this conversation moving forward.  Repeat echo in 6 months given equivocal findings on current echo.  Plan: - Continue apixaban  5 mg twice daily - Follow-up echocardiogram in 6 months - Patient to return in 3 months - Patient counseled on bleeding risks, and if he is doing activities, to wear a helmet, and to avoid scuba diving at this time  Hyperlipidemia 1 year ago, LDL 120.  Will repeat today.  Plan: - Follow-up with lipid panel  Patient discussed with Dr. Lovie Libby Blanch, DO Internal Medicine Resident PGY-3

## 2023-12-09 NOTE — Assessment & Plan Note (Signed)
 1 year ago, LDL 120.  Will repeat today.  Plan: - Follow-up with lipid panel

## 2023-12-09 NOTE — Patient Instructions (Addendum)
 Jay Vincent,Thank you for allowing me to take part in your care today.  Here are your instructions.  1.  Regarding your blood thinners, please continue taking your blood thinners.  If you develop any bleeding, please call your primary care physician.  I have sent in refills for your blood thinner.  Please continue to take apixaban  5 mg twice daily.  2.  Please return in 3 months.  At that time we can discuss the future of taking blood thinners.  Will also order you an ultrasound of your heart at that time.  3.  I will call you with the results of your cholesterol panel.  PLEASE BRING YOUR MEDICATIONS TO EVERY APPOINTMENT  Thank you, Dr. Tobie  If you have any other questions please contact the internal medicine clinic at [phone] If it is after hours, please call the [place] [facility] at [phone] and then ask the person who picks up for the resident on call.

## 2023-12-09 NOTE — Assessment & Plan Note (Signed)
 Patient recently hospitalized for Bilateral subsegmental and segmental pulmonary emboli with some concern for right heart strain with mild RVOT dilatation.  Patient was hospitalized from 08/30-08/31 and discharged with apixaban .  Patient has been adherent to his apixaban  regimen.  He reports having some shortness of breath but nothing to concerning per the patient.  He denies any chest pain.  He is up-to-date on all his cancer screening.  His last colonoscopy was 5 years ago, and at that time recommended 10 years afterwards.  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  He otherwise works in [employer].  He is active with riding ATVs and scuba diving.  Recommended avoiding scuba diving at this time.  Will defer hypercoagulable workup at this time.  At this time we will treat with apixaban  and likely due to ongoing risk factor of truck driving, will probably need indefinite treatment. Discussed this with patient today, and we will continue to have this conversation moving forward.  Repeat echo in 6 months given equivocal findings on current echo.  Plan: - Continue apixaban  5 mg twice daily - Follow-up echocardiogram in 6 months - Patient to return in 3 months - Patient counseled on bleeding risks, and if he is doing activities, to wear a helmet, and to avoid scuba diving at this time

## 2023-12-10 ENCOUNTER — Ambulatory Visit: Payer: Self-pay | Admitting: Student

## 2023-12-10 ENCOUNTER — Other Ambulatory Visit: Payer: Self-pay

## 2023-12-10 LAB — LIPID PANEL
Chol/HDL Ratio: 4 ratio (ref 0.0–5.0)
Cholesterol, Total: 179 mg/dL (ref 100–199)
HDL: 45 mg/dL (ref >39)
LDL Chol Calc (NIH): 121 mg/dL — ABNORMAL HIGH (ref 0–99)
Triglycerides: 66 mg/dL (ref 0–149)
VLDL Cholesterol Cal: 13 mg/dL (ref 5–40)

## 2023-12-10 MED ORDER — ATORVASTATIN CALCIUM 20 MG PO TABS
20.0000 mg | ORAL_TABLET | Freq: Every day | ORAL | 11 refills | Status: AC
  Filled 2023-12-10: qty 30, 30d supply, fill #0

## 2023-12-10 NOTE — Progress Notes (Signed)
 Internal Medicine Clinic Attending  Case discussed with the resident at the time of the visit.  We reviewed the resident's history and exam and pertinent patient test results.  I agree with the assessment, diagnosis, and plan of care documented in the resident's note.    Agree with starting statin for elevated cardiovascular risk.   Continue Eliqius. Agree with repeat TTE in Spring 2026 to assess RV function

## 2023-12-11 ENCOUNTER — Other Ambulatory Visit: Payer: Self-pay

## 2023-12-12 ENCOUNTER — Other Ambulatory Visit: Payer: Self-pay

## 2023-12-18 ENCOUNTER — Other Ambulatory Visit: Payer: Self-pay

## 2024-01-12 ENCOUNTER — Emergency Department (HOSPITAL_BASED_OUTPATIENT_CLINIC_OR_DEPARTMENT_OTHER)

## 2024-01-12 ENCOUNTER — Encounter (HOSPITAL_BASED_OUTPATIENT_CLINIC_OR_DEPARTMENT_OTHER): Payer: Self-pay

## 2024-01-12 ENCOUNTER — Other Ambulatory Visit: Payer: Self-pay

## 2024-01-12 ENCOUNTER — Emergency Department (HOSPITAL_BASED_OUTPATIENT_CLINIC_OR_DEPARTMENT_OTHER)
Admission: EM | Admit: 2024-01-12 | Discharge: 2024-01-12 | Disposition: A | Discharge status: Home / Self Care | Attending: Emergency Medicine | Admitting: Emergency Medicine

## 2024-01-12 DIAGNOSIS — Z7901 Long term (current) use of anticoagulants: Secondary | ICD-10-CM | POA: Diagnosis not present

## 2024-01-12 DIAGNOSIS — R0602 Shortness of breath: Secondary | ICD-10-CM | POA: Insufficient documentation

## 2024-01-12 LAB — CBC WITH DIFFERENTIAL/PLATELET
Abs Immature Granulocytes: 0.06 K/uL (ref 0.00–0.07)
Basophils Absolute: 0 K/uL (ref 0.0–0.1)
Basophils Relative: 1 %
Eosinophils Absolute: 0.3 K/uL (ref 0.0–0.5)
Eosinophils Relative: 5 %
HCT: 39.9 % (ref 39.0–52.0)
Hemoglobin: 12.9 g/dL — ABNORMAL LOW (ref 13.0–17.0)
Immature Granulocytes: 1 %
Lymphocytes Relative: 40 %
Lymphs Abs: 2.1 K/uL (ref 0.7–4.0)
MCH: 29.5 pg (ref 26.0–34.0)
MCHC: 32.3 g/dL (ref 30.0–36.0)
MCV: 91.3 fL (ref 80.0–100.0)
Monocytes Absolute: 0.5 K/uL (ref 0.1–1.0)
Monocytes Relative: 9 %
Neutro Abs: 2.3 K/uL (ref 1.7–7.7)
Neutrophils Relative %: 44 %
Platelets: 312 K/uL (ref 150–400)
RBC: 4.37 MIL/uL (ref 4.22–5.81)
RDW: 12.4 % (ref 11.5–15.5)
WBC: 5.2 K/uL (ref 4.0–10.5)
nRBC: 0 % (ref 0.0–0.2)

## 2024-01-12 LAB — BASIC METABOLIC PANEL WITH GFR
Anion gap: 11 (ref 5–15)
BUN: 15 mg/dL (ref 6–20)
CO2: 24 mmol/L (ref 22–32)
Calcium: 8.9 mg/dL (ref 8.9–10.3)
Chloride: 102 mmol/L (ref 98–111)
Creatinine, Ser: 1.06 mg/dL (ref 0.61–1.24)
GFR, Estimated: >60 mL/min (ref >60)
Glucose, Bld: 126 mg/dL — ABNORMAL HIGH (ref 70–99)
Potassium: 4.6 mmol/L (ref 3.5–5.1)
Sodium: 137 mmol/L (ref 135–145)

## 2024-01-12 LAB — PRO BRAIN NATRIURETIC PEPTIDE: Pro Brain Natriuretic Peptide: <50 pg/mL (ref <300.0)

## 2024-01-12 LAB — RESP PANEL BY RT-PCR (RSV, FLU A&B, COVID)  RVPGX2
Influenza A by PCR: NEGATIVE
Influenza B by PCR: NEGATIVE
Resp Syncytial Virus by PCR: NEGATIVE
SARS Coronavirus 2 by RT PCR: NEGATIVE

## 2024-01-12 LAB — TROPONIN T, HIGH SENSITIVITY: Troponin T High Sensitivity: <15 ng/L (ref 0–19)

## 2024-01-12 NOTE — ED Notes (Signed)
 Pt ambulated to BR w/o difficulty; maintained O2 sat of 98%, no resp distress

## 2024-01-12 NOTE — ED Triage Notes (Signed)
 Diagnosed with PE at the end of August, taking eliquis . Continues to have increased shortness of breath, intermittent chest pain.

## 2024-01-12 NOTE — Discharge Instructions (Addendum)
 It was a pleasure taking care of you today. You were seen in the Emergency Department for shortness of breath. Your work-up was reassuring. Your Xray and Labs showed no acute abnormalities to explain your symptoms.  No evidence of infection was seen on your x-ray.  Your cardiac enzyme was normal so not suggestive of strain on your heart. Refer to the attached documentation for further management of your symptoms.  Have ruled out possible life-threatening causes of your shortness of breath.  I recommend following with your PCP for further management of your shortness of breath.     Please return to the ER if you experience chest pain, trouble breathing, intractable nausea/vomiting or any other life threatening illnesses.

## 2024-01-12 NOTE — ED Provider Notes (Signed)
 2:39 PM patient seen in conjunction with Dufour PA-C.  Patient presents to the emergency department today for increasing shortness of breath.  He was diagnosed with pulmonary embolism at the end of August.  He is on Eliquis , compliant.  Reports some intermittent chest pains on the left side as well as increasing shortness of breath.  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  No worsening lower extremity swelling.  No lightheadedness or syncope.  Does feel dizzy at times.  No headache or strokelike symptoms.  Thus far workup is reassuring with normal troponin, normal BNP, unchanged EKG, chest x-ray clear.  Awaiting ambulatory pulse ox.  BP (!) 178/91 (BP Location: Right Arm)   Pulse 77   Temp 98.3 F (36.8 C) (Oral)   Resp 20   Ht 6' 5 (1.956 m)   Wt 116.6 kg   SpO2 100%   BMI 30.48 kg/m   5:59 PM Work-up today very reassuring. Trop, BNP neg, CXR clear. Ambulated without hypoxia, no tachycardia. At this time, no objective evidence of heart strain or anticoagulation failure. Do not feel patient requires reimaging at this time. No findings concerning for ACS, occult PNA.    BP 129/88   Pulse 65   Temp 98.3 F (36.8 C) (Oral)   Resp 17   Ht 6' 5 (1.956 m)   Wt 116.6 kg   SpO2 99%   BMI 30.48 kg/m     Desiderio Chew, PA-C 01/12/24 1800    Lenor Hollering, MD 01/14/24 1501

## 2024-01-12 NOTE — ED Provider Notes (Signed)
 [place] [facility] Provider Note   CSN: [id number] Arrival date & time: 01/12/24  1339     Patient presents with: Shortness of Breath   Jay Vincent is a 53 y.o. male with past medical history of a PE in August 2025, who presents to the emergency department for evaluation of shortness of breath.  Patient states shortness of breath has not gone away since he was diagnosed with his PE.  Patient on daily Eliquis .  Last dose this morning.  He also states approximately 1 week ago, he began having dizziness in which he describes the room is spinning.  Patient also states in the last 2 to 3 days he began having left-sided chest pain.  No recent sick contacts.  No headaches.   Shortness of Breath      Prior to Admission medications   Medication Sig Start Date End Date Taking? Authorizing Provider  apixaban  (ELIQUIS ) 5 MG TABS tablet Take 1 tablet (5 mg total) by mouth 2 (two) times daily. 12/09/23   Tobie Gaines, DO  APIXABAN  (ELIQUIS ) VTE STARTER PACK (10MG  AND 5MG ) Take as directed on package: start with two-5mg  tablets twice daily for 7 days. On day 8, switch to one-5mg  tablet twice daily. 12/01/23   Vernon Ranks, MD  atorvastatin  (LIPITOR) 20 MG tablet Take 1 tablet (20 mg total) by mouth daily. 12/10/23   Tobie Gaines, DO    Allergies: Patient has no known allergies.    Review of Systems  Respiratory:  Positive for shortness of breath.     Updated Vital Signs BP (!) 178/91 (BP Location: Right Arm)   Pulse 77   Temp 98.3 F (36.8 C) (Oral)   Resp 20   Ht 6' 5 (1.956 m)   Wt 116.6 kg   SpO2 100%   BMI 30.48 kg/m   Physical Exam Vitals and nursing note reviewed.  Constitutional:      Appearance: Normal appearance.  HENT:     Head: Normocephalic and atraumatic.     Mouth/Throat:     Mouth: Mucous membranes are moist.  Eyes:     General: No scleral icterus.       Right eye: No discharge.        Left eye: No discharge.      Conjunctiva/sclera: Conjunctivae normal.  Cardiovascular:     Rate and Rhythm: Normal rate and regular rhythm.     Pulses: Normal pulses.  Pulmonary:     Effort: Pulmonary effort is normal.     Breath sounds: Normal breath sounds. No wheezing.  Abdominal:     General: There is no distension.     Tenderness: There is no abdominal tenderness.  Musculoskeletal:        General: No deformity.     Cervical back: Normal range of motion.  Skin:    General: Skin is warm and dry.     Capillary Refill: Capillary refill takes less than 2 seconds.  Neurological:     Mental Status: He is alert.     Motor: No weakness.  Psychiatric:        Mood and Affect: Mood normal.     (all labs ordered are listed, but only abnormal results are displayed) Labs Reviewed  BASIC METABOLIC PANEL WITH GFR - Abnormal; Notable for the following components:      Result Value   Glucose, Bld 126 (*)    All other components within normal limits  CBC WITH DIFFERENTIAL/PLATELET - Abnormal;  Notable for the following components:   Hemoglobin 12.9 (*)    All other components within normal limits  RESP PANEL BY RT-PCR (RSV, FLU A&B, COVID)  RVPGX2  PRO BRAIN NATRIURETIC PEPTIDE  TROPONIN T, HIGH SENSITIVITY  TROPONIN T, HIGH SENSITIVITY    EKG: EKG Interpretation Date/Time:  Sunday January 12 2024 13:53:42 EDT Ventricular Rate:  68 PR Interval:  154 QRS Duration:  77 QT Interval:  395 QTC Calculation: 421 R Axis:   39  Text Interpretation: Sinus rhythm Abnormal R-wave progression, early transition Borderline ST elevation, lateral leads since last tracing no significant change Confirmed by Lenor Hollering [phone]) on 01/12/2024 1:56:47 PM  Radiology: [facility] Chest 2 View Result Date: 01/12/2024 CLINICAL DATA:  Shortness of breath EXAM: CHEST - 2 VIEW COMPARISON:  Chest radiograph dated 11/29/2023 FINDINGS: Normal lung volumes. No focal consolidations. No pleural effusion or pneumothorax. The heart size and  mediastinal contours are within normal limits. No acute osseous abnormality. IMPRESSION: No active cardiopulmonary disease. Electronically Signed   By: Limin  Xu M.D.   On: 01/12/2024 14:28    Procedures   Medications Ordered in the ED - No data to display                               Medical Decision Making Amount and/or Complexity of Data Reviewed Labs: ordered. Radiology: ordered.   This patient presents to the ED for concern of shortness of breath, this involves an extensive number of treatment options, and is a complaint that carries with it a high risk of complications and morbidity.  Differential diagnosis includes: Pulmonary embolism, ACS, pneumonia, flu, COVID, costochondritis, asthma, heart failure exacerbation, COPD exacerbation  Co morbidities:  prior history of PE   Lab Tests:  I Ordered, and personally interpreted labs.  No acute abnormalities  Imaging Studies:  I ordered imaging studies including chest x-ray I independently visualized and interpreted imaging which showed no evidence of cardiopulmonary disease I agree with the radiologist interpretation  Cardiac Monitoring/ECG:  The patient was maintained on a cardiac monitor.  I personally viewed and interpreted the cardiac monitored which showed an underlying rhythm of: Sinus rhythm  Medicines ordered and prescription drug management:  No medication indicated  Test Considered:   D-dimer  Critical Interventions:   none  Consultations Obtained: None  Problem List / ED Course:     ICD-10-CM   1. SOB (shortness of breath)  R06.02       MDM: 53 year old male presents the emergency department for evaluation of shortness of breath.  Patient was diagnosed with a PE on 11/29/2023.  He has been compliant with his Eliquis , last dose this morning.  Patient states his shortness of breath has not resolved since the time of his PE diagnosis.  Over the last week he also reports dizziness associated with  shortness of breath and over the last 2 to 3 days reports left-sided chest pain.  EKG shows normal sinus rhythm.  Chest x-ray with no evidence of acute cardiopulmonary disease or abnormality.  Troponin less than 15.  Given length of patient's symptoms, second troponin unnecessary.  BNP less than 50.  Aspiratory panel negative.  Patient's SpO2 at rest is 100% and during ambulation SpO2 was maintained at 98% and above. Patient's workup is reassuring at this time. Patient is also compliant with his Eliquis . There is no other further indication to consider possible PE.  Patient is clinically stable and I  recommended he follow-up with his PCP for further management of his symptoms outpatient.  Patient has been educated on importance of continuing to be compliant with his Eliquis  therapy.  He is hemodynamically stable at this time.  Patient stable for discharge.   Dispostion:  After consideration of the diagnostic results and the patients response to treatment, I feel that the patient would benefit from outpatient follow-up with his PCP.    Final diagnoses:  SOB (shortness of breath)    ED Discharge Orders     None          Torrence Marry RAMAN, PA-C 01/12/24 1533    Lenor Hollering, MD 01/14/24 1501

## 2024-01-13 ENCOUNTER — Ambulatory Visit: Admitting: Student

## 2024-01-13 ENCOUNTER — Other Ambulatory Visit: Payer: Self-pay

## 2024-01-13 VITALS — BP 123/73 | HR 71 | Temp 97.9°F | Ht 77.0 in | Wt 240.0 lb

## 2024-01-13 DIAGNOSIS — Z7901 Long term (current) use of anticoagulants: Secondary | ICD-10-CM | POA: Diagnosis not present

## 2024-01-13 DIAGNOSIS — Z87891 Personal history of nicotine dependence: Secondary | ICD-10-CM

## 2024-01-13 DIAGNOSIS — I2699 Other pulmonary embolism without acute cor pulmonale: Secondary | ICD-10-CM

## 2024-01-13 NOTE — Assessment & Plan Note (Signed)
 This is an ED visit for this issue, which was diagnosed 6 weeks ago.  PE diagnosed 8/31, appears to be unprovoked, on Eliquis  therapy and adherent.  Has had intermittent shortness of breath with associated chest pain, stabbing versus squeezing in nature, since then.  Was having increased frequency and severity of these episodes and so went to the ER yesterday.  Workup negative.  Troponin, BNP, RVP unrevealing.  No hypoxia including with ambulation.  Was sent home.  In office today, he is feeling well, vital stable, no hypoxia including with ambulation.  Did get a bit dizzy when standing but orthostatics are negative.  On exam clear lungs and no swelling of the legs.  On history it looks like there is some concern of right heart strain and lower extremity DVTs were obtained.  Do not suspect recurrence of embolism.  Suspect disease course of pulmonary embolism with overall improvement anticipated over the coming weeks.  Will have her move his echo forward and reassess sooner.  Otherwise reassurance provided.  If echo shows continued heart dysfunction will refer to advanced heart failure.

## 2024-01-13 NOTE — Progress Notes (Signed)
   CC: ED FU dyspnea and CP, diagnosed with PE 6 weeks ago and on eliquis  now  HPI:  Jay Vincent is a 53 y.o. male with a PMH stated below who presents today for ED follow up. He states his symptoms are near baseline and is in no acute distress today.  Please see problem based assessment and plan for additional details.  Past Medical History:  Diagnosis Date   History of pneumonia 02/12/2015   Lumbar spondylolysis    Surgery in 1992 (Dr. Cornelius). MRIs in 2003 and 2004. Neurosurg visit 12/04, no intervention at the time, had negative neuro exam   Neck pain    Normocytic anemia 02/12/2015   Olecranon bursitis, left elbow 01/16/2016   Review of Systems: ROS negative except for what is noted on the assessment and plan.  Vitals:   01/13/24 0950  BP: 123/73  Pulse: 71  Temp: 97.9 F (36.6 C)  TempSrc: Oral  SpO2: 97%  Weight: 240 lb (108.9 kg)  Height: 6' 5 (1.956 m)   Physical Exam: Constitutional: well-appearing man in no acute distress Cardiovascular: regular rate and rhythm, no m/r/g Pulmonary/Chest: normal work of breathing on room air, lungs clear to auscultation bilaterally MSK: normal bulk and tone. No LE edema. Skin: warm and dry Psych: normal mood and behavior  Assessment & Plan:   Patient discussed with Dr. Lovie  Pulmonary embolism [facility]) This is an ED visit for this issue, which was diagnosed 6 weeks ago.  PE diagnosed 8/31, appears to be unprovoked, on Eliquis  therapy and adherent.  Has had intermittent shortness of breath with associated chest pain, stabbing versus squeezing in nature, since then.  Was having increased frequency and severity of these episodes and so went to the ER yesterday.  Workup negative.  Troponin, BNP, RVP unrevealing.  No hypoxia including with ambulation.  Was sent home.  In office today, he is feeling well, vital stable, no hypoxia including with ambulation.  Did get a bit dizzy when standing but orthostatics are negative.   On exam clear lungs and no swelling of the legs.  On history it looks like there is some concern of right heart strain and lower extremity DVTs were obtained.  Do not suspect recurrence of embolism.  Suspect disease course of pulmonary embolism with overall improvement anticipated over the coming weeks.  Will have her move his echo forward and reassess sooner.  Otherwise reassurance provided.  If echo shows continued heart dysfunction will refer to advanced heart failure.  RTC 6 months or sooner if needed.  Lonni Africa, D.O. [facility] Internal Medicine, PGY-2 Phone: [phone] Date 01/13/2024 Time 1:05 PM

## 2024-01-14 NOTE — Progress Notes (Signed)
 Internal Medicine Clinic Attending  Case discussed with the resident at the time of the visit.  We reviewed the resident's history and exam and pertinent patient test results.  I agree with the assessment, diagnosis, and plan of care documented in the resident's note.    We had planned for repeat TTE in 6 months, but I want to get this now to evaluate RV

## 2024-01-23 ENCOUNTER — Ambulatory Visit (HOSPITAL_COMMUNITY)

## 2024-02-14 ENCOUNTER — Other Ambulatory Visit (HOSPITAL_COMMUNITY): Payer: Self-pay

## 2024-02-14 ENCOUNTER — Other Ambulatory Visit: Payer: Self-pay

## 2024-02-28 ENCOUNTER — Ambulatory Visit (HOSPITAL_COMMUNITY)
Admission: RE | Admit: 2024-02-28 | Discharge: 2024-02-28 | Disposition: A | Source: Ambulatory Visit | Attending: Internal Medicine

## 2024-02-28 DIAGNOSIS — I2782 Chronic pulmonary embolism: Secondary | ICD-10-CM | POA: Insufficient documentation

## 2024-02-28 DIAGNOSIS — E785 Hyperlipidemia, unspecified: Secondary | ICD-10-CM | POA: Insufficient documentation

## 2024-02-28 DIAGNOSIS — I2602 Saddle embolus of pulmonary artery with acute cor pulmonale: Secondary | ICD-10-CM | POA: Diagnosis not present

## 2024-02-28 DIAGNOSIS — I071 Rheumatic tricuspid insufficiency: Secondary | ICD-10-CM | POA: Diagnosis not present

## 2024-02-28 DIAGNOSIS — Z87891 Personal history of nicotine dependence: Secondary | ICD-10-CM | POA: Diagnosis not present

## 2024-02-28 LAB — ECHOCARDIOGRAM COMPLETE
AR max vel: 2.65 cm2
AV Area VTI: 2.55 cm2
AV Area mean vel: 2.5 cm2
AV Mean grad: 2 mmHg
AV Peak grad: 4.2 mmHg
Ao pk vel: 1.02 m/s
Area-P 1/2: 4.6 cm2
S' Lateral: 2.66 cm

## 2024-03-24 ENCOUNTER — Other Ambulatory Visit: Payer: Self-pay

## 2024-03-24 ENCOUNTER — Other Ambulatory Visit (HOSPITAL_COMMUNITY): Payer: Self-pay

## 2024-03-27 ENCOUNTER — Other Ambulatory Visit: Payer: Self-pay

## 2024-04-19 ENCOUNTER — Other Ambulatory Visit: Payer: Self-pay | Admitting: Student

## 2024-04-20 ENCOUNTER — Other Ambulatory Visit: Payer: Self-pay

## 2024-04-20 MED ORDER — APIXABAN 5 MG PO TABS
5.0000 mg | ORAL_TABLET | Freq: Two times a day (BID) | ORAL | 3 refills | Status: AC
  Filled 2024-04-20: qty 60, 30d supply, fill #0

## 2024-04-20 NOTE — Telephone Encounter (Signed)
 Medication sent to pharmacy

## 2024-04-30 ENCOUNTER — Other Ambulatory Visit: Payer: Self-pay

## 2024-06-08 ENCOUNTER — Other Ambulatory Visit (HOSPITAL_COMMUNITY)
